# Patient Record
Sex: Female | Born: 1980
Health system: Southern US, Community
[De-identification: ages and names within clinical notes are randomized; demographics above are authoritative.]

## PROBLEM LIST (undated history)

## (undated) DIAGNOSIS — O99330 Smoking (tobacco) complicating pregnancy, unspecified trimester: Secondary | ICD-10-CM

---
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
## 2007-02-25 ENCOUNTER — Ambulatory Visit: Payer: Self-pay | Admitting: Internal Medicine

## 2012-09-17 ENCOUNTER — Observation Stay: Payer: Self-pay | Admitting: Obstetrics and Gynecology

## 2012-09-18 ENCOUNTER — Inpatient Hospital Stay: Payer: Self-pay | Admitting: Obstetrics & Gynecology

## 2012-09-18 LAB — CBC WITH DIFFERENTIAL/PLATELET
Basophil %: 0.4 %
Eosinophil %: 0.5 %
Lymphocyte #: 1.7 10*3/uL (ref 1.0–3.6)
Lymphocyte %: 11.5 %
MCHC: 34.8 g/dL (ref 32.0–36.0)
MCV: 84 fL (ref 80–100)
Monocyte #: 1 x10 3/mm — ABNORMAL HIGH (ref 0.2–0.9)
Monocyte %: 7 %
Neutrophil %: 80.6 %
RDW: 13.2 % (ref 11.5–14.5)
WBC: 14.8 10*3/uL — ABNORMAL HIGH (ref 3.6–11.0)

## 2012-09-19 LAB — HEMATOCRIT: HCT: 23.4 % — ABNORMAL LOW (ref 35.0–47.0)

## 2014-06-12 NOTE — H&P (Signed)
L&D Evaluation:  History Expanded:  HPI Patient presents to Labor and Delivery with painful contractions. Patient reports that they started yesterday and have since gotten more intense. Denies fluid leaking or bleeding. + FM.   Gravida 2   Term 0   PreTerm 0   Abortion 1   Living 0   Blood Type (Maternal) O positive   Group B Strep Results Maternal (Result >5wks must be treated as unknown) positive   Maternal HIV Negative   Maternal Syphilis Ab Nonreactive   Maternal Varicella Immune   Rubella Results (Maternal) immune   Maternal T-Dap Immune   [facility]   Presents with contractions   Patient's Medical History No Chronic Illness  other  iron deficiency anemia   Patient's Surgical History other  wisdom teeth removal   Medications Pre Natal Vitamins  Iron   Allergies NKDA   Social History none   Family History other  breast CA, stomach CA, colon CA   ROS:  ROS All systems were reviewed.  HEENT, CNS, GI, GU, Respiratory, CV, Renal and Musculoskeletal systems were found to be normal.   Exam:  Vital Signs stable   General no apparent distress   Mental Status clear   Chest clear   Heart normal sinus rhythm   Abdomen gravid, non-tender   Edema no edema   Pelvic 5/100/0 by RN   Mebranes Intact   FHT normal rate with no decels   Fetal Heart Rate 135   Skin dry   Impression:  Impression active labor   Plan:  Plan EFM/NST, monitor contractions and for cervical change, antibiotics for GBBS prophylaxis, fluids   Comments ABX for GBS Epidural as desired for pain relief Anticipate spontaneous vaginal delivery   Electronic Signatures: Letitia LibraHarris, Robert Paul (MD)  (Signed 17-Aug-14 18:07)  Authored: L&D Evaluation   Last Updated: 17-Aug-14 18:07 by Letitia LibraHarris, Robert Paul (MD)

## 2016-03-10 ENCOUNTER — Ambulatory Visit (INDEPENDENT_AMBULATORY_CARE_PROVIDER_SITE_OTHER): Payer: BLUE CROSS/BLUE SHIELD | Admitting: Primary Care

## 2016-03-10 ENCOUNTER — Encounter: Payer: Self-pay | Admitting: Primary Care

## 2016-03-10 ENCOUNTER — Other Ambulatory Visit (HOSPITAL_COMMUNITY)
Admission: RE | Admit: 2016-03-10 | Discharge: 2016-03-10 | Disposition: A | Discharge status: Home / Self Care | Payer: BLUE CROSS/BLUE SHIELD | Source: Ambulatory Visit | Attending: Primary Care | Admitting: Primary Care

## 2016-03-10 VITALS — BP 132/76 | HR 80 | Temp 98.1°F | Ht 70.0 in | Wt 146.0 lb

## 2016-03-10 DIAGNOSIS — N898 Other specified noninflammatory disorders of vagina: Secondary | ICD-10-CM | POA: Diagnosis not present

## 2016-03-10 DIAGNOSIS — Z01419 Encounter for gynecological examination (general) (routine) without abnormal findings: Secondary | ICD-10-CM | POA: Diagnosis present

## 2016-03-10 DIAGNOSIS — Z124 Encounter for screening for malignant neoplasm of cervix: Secondary | ICD-10-CM

## 2016-03-10 DIAGNOSIS — Z0000 Encounter for general adult medical examination without abnormal findings: Secondary | ICD-10-CM | POA: Diagnosis not present

## 2016-03-10 DIAGNOSIS — Z23 Encounter for immunization: Secondary | ICD-10-CM

## 2016-03-10 DIAGNOSIS — Z1151 Encounter for screening for human papillomavirus (HPV): Secondary | ICD-10-CM | POA: Insufficient documentation

## 2016-03-10 LAB — COMPREHENSIVE METABOLIC PANEL
ALT: 10 U/L (ref 0–35)
AST: 13 U/L (ref 0–37)
Albumin: 4.7 g/dL (ref 3.5–5.2)
Alkaline Phosphatase: 67 U/L (ref 39–117)
BUN: 10 mg/dL (ref 6–23)
CALCIUM: 9.4 mg/dL (ref 8.4–10.5)
CHLORIDE: 108 meq/L (ref 96–112)
CO2: 28 meq/L (ref 19–32)
Creatinine, Ser: 0.68 mg/dL (ref 0.40–1.20)
GFR: 105.2 mL/min (ref >60.00)
Glucose, Bld: 83 mg/dL (ref 70–99)
Potassium: 4 mEq/L (ref 3.5–5.1)
Sodium: 138 mEq/L (ref 135–145)
Total Bilirubin: 0.6 mg/dL (ref 0.2–1.2)
Total Protein: 7.4 g/dL (ref 6.0–8.3)

## 2016-03-10 LAB — LIPID PANEL
Cholesterol: 131 mg/dL (ref 0–200)
HDL: 37.5 mg/dL — ABNORMAL LOW (ref >39.00)
LDL CALC: 79 mg/dL (ref 0–99)
NonHDL: 93.35
TRIGLYCERIDES: 73 mg/dL (ref 0.0–149.0)
Total CHOL/HDL Ratio: 3
VLDL: 14.6 mg/dL (ref 0.0–40.0)

## 2016-03-10 NOTE — Addendum Note (Signed)
Addended by: Tawnya CrookSAMBATH, Alexxus Sobh on: 03/10/2016 10:28 AM   Modules accepted: Orders

## 2016-03-10 NOTE — Assessment & Plan Note (Signed)
Td UTD per patient, will review records. Influenza vaccination provided today. Pap due, pending. Commended her on her healthy diet, encouraged exercise. Exam today with minimal white vaginal discharge, otherwise unremarkable. Labs pending. Follow up in 1 year for repeat physical.

## 2016-03-10 NOTE — Progress Notes (Signed)
Pre visit review using our clinic review tool, if applicable. No additional management support is needed unless otherwise documented below in the visit note. 

## 2016-03-10 NOTE — Progress Notes (Signed)
Subjective:    Patient ID: Alyssa [name], female    DOB: [birth year], 36 y.o.   MRN: [id number]  HPI  Alyssa [name] is a 36 year old female who presents today to establish care and discuss the problems mentioned below. Will obtain old records.  who presents today for complete physical.  Immunizations: -Tetanus: Unsure -Influenza: Due   Diet: She endorses a fair diet. Breakfast: Bagel, cream cheese, fruit Lunch: Sandwich, chips Dinner: Meat, vegetables, starch Snacks: Fruit Desserts: None Beverages: Water, soda  Exercise: She does not currently exercise Eye exam: Completed in 2017 Dental exam: Completes annually, plans to schedule Pap Smear: Follows with GYN.   Review of Systems  Constitutional: Negative for unexpected weight change.  HENT: Negative for rhinorrhea.   Respiratory: Negative for cough and shortness of breath.   Cardiovascular: Negative for chest pain.  Gastrointestinal: Negative for constipation and diarrhea.  Genitourinary: Positive for vaginal discharge. Negative for difficulty urinating, genital sores, menstrual problem and pelvic pain.       Thick, clear discharge intermittently for the last several weeks. She denies pain, vaginal itching.   Musculoskeletal: Negative for arthralgias and myalgias.  Skin: Negative for rash.  Allergic/Immunologic: Negative for environmental allergies.  Neurological: Negative for dizziness, numbness and headaches.  Psychiatric/Behavioral:       She denies concerns for anxiety or depression       No past medical history on file.   Social History   Social History  . Marital status: Single    Spouse name: N/A  . Number of children: N/A  . Years of education: N/A   Occupational History  . Not on file.   Social History Main Topics  . Smoking status: Current Every Day Smoker  . Smokeless tobacco: Never Used  . Alcohol use Yes  . Drug use: Unknown  . Sexual activity: Not on file   Other Topics Concern  . Not on  file   Social History Narrative   Engaged.   2 children.   Works as a [employer] certified accountant.   Enjoys spending time with children.      No past surgical history on file.  Family History  Problem Relation Age of Onset  . Hypertension Mother   . Hypertension Brother   . Breast cancer Maternal Grandmother   . Colon cancer Maternal Grandfather   . Breast cancer Paternal Grandmother     No Known Allergies  No current outpatient prescriptions on file prior to visit.   No current facility-administered medications on file prior to visit.     BP 132/76   Pulse 80   Temp 98.1 F (36.7 C) (Oral)   Ht 5\' 10"  (1.778 m)   Wt 146 lb (66.2 kg)   LMP 02/09/2016 (Within Days)   SpO2 99%   BMI 20.95 kg/m    Objective:   Physical Exam  Constitutional: She is oriented to person, place, and time. She appears well-nourished.  HENT:  Right Ear: Tympanic membrane and ear canal normal.  Left Ear: Tympanic membrane and ear canal normal.  Nose: Nose normal.  Mouth/Throat: Oropharynx is clear and moist.  Eyes: Conjunctivae and EOM are normal. Pupils are equal, round, and reactive to light.  Neck: Neck supple. No thyromegaly present.  Cardiovascular: Normal rate and regular rhythm.   No murmur heard. Pulmonary/Chest: Effort normal and breath sounds normal. She has no rales.  Abdominal: Soft. Bowel sounds are normal. There is no tenderness.  Genitourinary: There is no tenderness or lesion on  the right labia. There is no tenderness or lesion on the left labia. Cervix exhibits discharge. Cervix exhibits no motion tenderness. Vaginal discharge found.  Genitourinary Comments: Thick, whitish discharge  Musculoskeletal: Normal range of motion.  Lymphadenopathy:    She has no cervical adenopathy.  Neurological: She is alert and oriented to person, place, and time. She has normal reflexes. No cranial nerve deficit.  Skin: Skin is warm and dry. No rash noted.  Psychiatric: She has a normal mood and affect.            Assessment & Plan:  Vaginal Discharge:  Thick, clear, clumpy for the past several weeks. Intermittent. No vaginal itching, pelvic pain, urinary symptoms. Exam today: small amount of thick, white discharge, otherwise unremarkable. Wet Prep and Gonorrhea/Chlamydia sent off and is pending. Will await results.  Morrie Sheldonlark,Katherine Kendal, NP

## 2016-03-10 NOTE — Patient Instructions (Signed)
Complete lab work prior to leaving today. I will notify you of your lab and vaginal results once received.   Continue to eat a balanced diet. Limit sugary drinks.  Ensure you are consuming 64 ounces of water daily.  Start exercising. You should be getting 150 minutes of moderate intensity exercise weekly.  Follow up in 1 year for a repeat physical or sooner if needed.  It was a pleasure to meet you today! Please don't hesitate to call me with any questions. [facility] to [facility]!

## 2016-03-11 LAB — WET PREP BY MOLECULAR PROBE
CANDIDA SPECIES: NEGATIVE
GARDNERELLA VAGINALIS: NEGATIVE
Trichomonas vaginosis: NEGATIVE

## 2016-03-11 LAB — GC/CHLAMYDIA PROBE AMP
CT PROBE, AMP APTIMA: NOT DETECTED
GC Probe RNA: NOT DETECTED

## 2016-03-11 LAB — CYTOLOGY - PAP
Diagnosis: NEGATIVE
HPV: NOT DETECTED

## 2016-03-30 ENCOUNTER — Ambulatory Visit (INDEPENDENT_AMBULATORY_CARE_PROVIDER_SITE_OTHER): Payer: BLUE CROSS/BLUE SHIELD | Admitting: Podiatry

## 2016-03-30 ENCOUNTER — Encounter: Payer: Self-pay | Admitting: Podiatry

## 2016-03-30 VITALS — BP 121/78 | HR 80 | Resp 16

## 2016-03-30 DIAGNOSIS — L600 Ingrowing nail: Secondary | ICD-10-CM

## 2016-03-30 DIAGNOSIS — L603 Nail dystrophy: Secondary | ICD-10-CM | POA: Diagnosis not present

## 2016-03-30 MED ORDER — NEOMYCIN-POLYMYXIN-HC 1 % OT SOLN: OTIC | 1 refills | Status: DC

## 2016-03-30 NOTE — Patient Instructions (Signed)

## 2016-03-30 NOTE — Progress Notes (Signed)
   Subjective:    Patient ID: Larinda ButteryErika K Jensen, female    DOB: Jul 28, 1980, 36 y.o.   MRN: [id number]  HPI: Vital signs are stable she is alert and oriented 3. She presents today with chief concern of painful ingrown toenails to the tibia and fibula borders of the hallux bilateral. Also concerned about the discoloration of the nails.    Review of Systems  Neurological: Positive for numbness.       Hands   All other systems reviewed and are negative.      Objective:   Physical Exam: Vital signs are stable alert and oriented 3. Pulses are strongly palpable. Neurologic sensorium is intact. Deep tendon reflexes are intact. Muscle strength is 5 over 5 dorsiflexion plantar flexors and inverters everters onto the musculature is intact. Orthopedic evaluation shows all joints distal to the ankle full range of motion without crepitation. Cutaneous evaluation of Mr. Sharpe rated now margins with mild erythema and edema along the tibial borders of the hallux bilateral.        Assessment & Plan:  Ingrown toenail tibial border hallux bilateral.  Plan: Chemical matrixectomy's were performed today along the tibia-fibula border after local anesthesia was administered. She tolerated this procedure well without complications. She is provided with bipolar written home-going instructions for care and soaking of her toe as well as a prescription for Cortisporin Otic to be applied twice daily upset. Several of the nail and skin taken from the distal aspect of the nails today and sent for pathologic evaluation.

## 2016-04-20 ENCOUNTER — Ambulatory Visit (INDEPENDENT_AMBULATORY_CARE_PROVIDER_SITE_OTHER): Payer: BLUE CROSS/BLUE SHIELD | Admitting: Podiatry

## 2016-04-20 ENCOUNTER — Encounter: Payer: Self-pay | Admitting: Podiatry

## 2016-04-20 DIAGNOSIS — L03031 Cellulitis of right toe: Secondary | ICD-10-CM

## 2016-04-20 MED ORDER — DOXYCYCLINE HYCLATE 100 MG PO TABS: 100.0000 mg | ORAL_TABLET | Freq: Two times a day (BID) | ORAL | 0 refills | Status: DC

## 2016-04-20 NOTE — Progress Notes (Signed)
She presents today for follow-up of matrixectomy, hallux bilaterally. She states that they're doing pretty good with exception of this much force in the fibular border of the hallux right. She is also here to talk about her pathology from her nails. She's been soaking 1 gallon with 1 tablespoon of Epsom salts.  Objective: Vital signs are stable alert and oriented 3. Pulses are palpable. Neurologic sensorium is intact percent was C monofilament. Deep tendon reflexes are intact. Muscle strength is 5 over 5 dorsally splint flexors everters evertors of his musculature is intact. Mild erythema along the proximal nail fold of the right hallux fibular border mild paronychia is present status post matrixectomy. Pathology report demonstrates negative for fungus.  Assessment: Paronychia abscess fibular border hallux right. Negative fungus.  Plan: Increase the Epsom salts 1 cup per gallon of water. I also recommended that she start doxycycline 150 mg twice daily follow-up with her in 2 weeks.

## 2016-04-21 ENCOUNTER — Telehealth: Payer: Self-pay | Admitting: *Deleted

## 2016-04-21 NOTE — Telephone Encounter (Addendum)
-----   Message from Elinor ParkinsonMax T Hyatt, [place]PM sent at 04/11/2016 11:44 AM EST ----- Negative for fungus. 04/21/2016-Left message that Dr. Al CorpusHyatt had reviewed her culture results as negative and no treatment was needed at this time.

## 2016-05-11 ENCOUNTER — Ambulatory Visit (INDEPENDENT_AMBULATORY_CARE_PROVIDER_SITE_OTHER): Payer: BLUE CROSS/BLUE SHIELD | Admitting: Podiatry

## 2016-05-11 ENCOUNTER — Encounter: Payer: Self-pay | Admitting: Podiatry

## 2016-05-11 DIAGNOSIS — L03031 Cellulitis of right toe: Secondary | ICD-10-CM | POA: Diagnosis not present

## 2016-05-11 NOTE — Progress Notes (Signed)
She presents today for follow-up of her paronychia. She states is doing much better is still a [place] pink in the back corner she refers fibular border hallux right.  Objective: Mild erythema no purulence or drainage is noted. Mild tenderness.  Assessment: Paronychia resolving hallux right status post matrixectomy.  Plan: Discontinue Cortisporin otic drops. Soak once daily covered during the day and leave open at bedtime. Follow-up with me in 2 weeks if not improved. I expressed to her that it may be necessary to perform another procedure.

## 2016-06-18 ENCOUNTER — Emergency Department
Admission: EM | Admit: 2016-06-18 | Discharge: 2016-06-18 | Disposition: A | Discharge status: Home / Self Care | Payer: BLUE CROSS/BLUE SHIELD | Attending: Emergency Medicine | Admitting: Emergency Medicine

## 2016-06-18 ENCOUNTER — Encounter: Payer: Self-pay | Admitting: *Deleted

## 2016-06-18 DIAGNOSIS — R1032 Left lower quadrant pain: Secondary | ICD-10-CM | POA: Diagnosis not present

## 2016-06-18 DIAGNOSIS — R197 Diarrhea, unspecified: Secondary | ICD-10-CM | POA: Diagnosis not present

## 2016-06-18 DIAGNOSIS — F17220 Nicotine dependence, chewing tobacco, uncomplicated: Secondary | ICD-10-CM | POA: Insufficient documentation

## 2016-06-18 LAB — CBC
HCT: 40 % (ref 35.0–47.0)
HEMOGLOBIN: 13.8 g/dL (ref 12.0–16.0)
MCH: 28.6 pg (ref 26.0–34.0)
MCHC: 34.4 g/dL (ref 32.0–36.0)
MCV: 83.1 fL (ref 80.0–100.0)
Platelets: 203 10*3/uL (ref 150–440)
RBC: 4.81 MIL/uL (ref 3.80–5.20)
RDW: 13.5 % (ref 11.5–14.5)
WBC: 7.3 10*3/uL (ref 3.6–11.0)

## 2016-06-18 LAB — COMPREHENSIVE METABOLIC PANEL
ALT: 26 U/L (ref 14–54)
ANION GAP: 8 (ref 5–15)
AST: 32 U/L (ref 15–41)
Albumin: 4.9 g/dL (ref 3.5–5.0)
Alkaline Phosphatase: 75 U/L (ref 38–126)
BUN: 5 mg/dL — ABNORMAL LOW (ref 6–20)
CHLORIDE: 102 mmol/L (ref 101–111)
CO2: 25 mmol/L (ref 22–32)
CREATININE: 0.7 mg/dL (ref 0.44–1.00)
Calcium: 9.1 mg/dL (ref 8.9–10.3)
GFR calc non Af Amer: >60 mL/min (ref >60)
Glucose, Bld: 90 mg/dL (ref 65–99)
Potassium: 3 mmol/L — ABNORMAL LOW (ref 3.5–5.1)
SODIUM: 135 mmol/L (ref 135–145)
Total Bilirubin: 0.5 mg/dL (ref 0.3–1.2)
Total Protein: 8.3 g/dL — ABNORMAL HIGH (ref 6.5–8.1)

## 2016-06-18 LAB — URINALYSIS, COMPLETE (UACMP) WITH MICROSCOPIC
Bilirubin Urine: NEGATIVE
Glucose, UA: NEGATIVE mg/dL
Hgb urine dipstick: NEGATIVE
KETONES UR: NEGATIVE mg/dL
Leukocytes, UA: NEGATIVE
Nitrite: NEGATIVE
PROTEIN: 30 mg/dL — AB
Specific Gravity, Urine: 1.016 (ref 1.005–1.030)
pH: 5 (ref 5.0–8.0)

## 2016-06-18 LAB — POC URINE PREG, ED: Preg Test, Ur: NEGATIVE

## 2016-06-18 LAB — LIPASE, BLOOD: Lipase: 16 U/L (ref 11–51)

## 2016-06-18 MED ORDER — LOPERAMIDE HCL 2 MG PO CAPS
2.0000 mg | ORAL_CAPSULE | Freq: Once | ORAL | Status: AC
  Administered 2016-06-18: 2 mg via ORAL
  Filled 2016-06-18: qty 1

## 2016-06-18 MED ORDER — DICYCLOMINE HCL 20 MG PO TABS: 20.0000 mg | ORAL_TABLET | Freq: Three times a day (TID) | ORAL | 0 refills | Status: DC | PRN

## 2016-06-18 MED ORDER — LOPERAMIDE HCL 2 MG PO CHEW: 1.0000 | CHEWABLE_TABLET | Freq: Four times a day (QID) | ORAL | 0 refills | Status: DC | PRN

## 2016-06-18 MED ORDER — DICYCLOMINE HCL 10 MG PO CAPS
20.0000 mg | ORAL_CAPSULE | Freq: Once | ORAL | Status: AC
  Administered 2016-06-18: 20 mg via ORAL
  Filled 2016-06-18: qty 2

## 2016-06-18 MED ORDER — CIPROFLOXACIN HCL 500 MG PO TABS
750.0000 mg | ORAL_TABLET | Freq: Once | ORAL | Status: AC
  Administered 2016-06-18: 750 mg via ORAL
  Filled 2016-06-18: qty 2

## 2016-06-18 NOTE — ED Triage Notes (Signed)
Pt to ED reporting abd pain today with bloating and constipation. Pt reports having been nauseous and having diarrhea since Monday but then beginning today pt can not have BM and feels "pregnant" Pt reports generalized abd pain with intermittent sharp 9/1 pains. Bowel sounds still intact. No fevers reported.

## 2016-06-18 NOTE — Discharge Instructions (Signed)
If your pain persists for another 48 hours please return to the emergency department for reevaluation. Please return sooner for any new or worsening symptoms such as if you cannot eat or drink, for fevers or chills, or for any other concerns whatsoever.  It was a pleasure to take care of you today, and thank you for coming to our emergency department.  If you have any questions or concerns before leaving please ask the nurse to grab me and I'm more than happy to go through your aftercare instructions again.  If you were prescribed any opioid pain medication today such as Norco, Vicodin, Percocet, morphine, hydrocodone, or oxycodone please make sure you do not drive when you are taking this medication as it can alter your ability to drive safely.  If you have any concerns once you are home that you are not improving or are in fact getting worse before you can make it to your follow-up appointment, please do not hesitate to call 911 and come back for further evaluation.  Merrily BrittleNeil Elvis Laufer MD  Results for orders placed or performed during the hospital encounter of 06/18/16  Lipase, blood  Result Value Ref Range   Lipase 16 11 - 51 U/L  Comprehensive metabolic panel  Result Value Ref Range   Sodium 135 135 - 145 mmol/L   Potassium 3.0 (L) 3.5 - 5.1 mmol/L   Chloride 102 101 - 111 mmol/L   CO2 25 22 - 32 mmol/L   Glucose, Bld 90 65 - 99 mg/dL   BUN 5 (L) 6 - 20 mg/dL   Creatinine, Ser 1.610.70 0.44 - 1.00 mg/dL   Calcium 9.1 8.9 - 09.610.3 mg/dL   Total Protein 8.3 (H) 6.5 - 8.1 g/dL   Albumin 4.9 3.5 - 5.0 g/dL   AST 32 15 - 41 U/L   ALT 26 14 - 54 U/L   Alkaline Phosphatase 75 38 - 126 U/L   Total Bilirubin 0.5 0.3 - 1.2 mg/dL   GFR calc non Af Amer >60 >60 mL/min   GFR calc Af Amer >60 >60 mL/min   Anion gap 8 5 - 15  CBC  Result Value Ref Range   WBC 7.3 3.6 - 11.0 K/uL   RBC 4.81 3.80 - 5.20 MIL/uL   Hemoglobin 13.8 12.0 - 16.0 g/dL   HCT 04.540.0 40.935.0 - 81.147.0 %   MCV 83.1 80.0 - 100.0 fL   MCH 28.6 26.0 - 34.0 pg   MCHC 34.4 32.0 - 36.0 g/dL   RDW 91.413.5 78.211.5 - 95.614.5 %   Platelets 203 150 - 440 K/uL  Urinalysis, Complete w Microscopic  Result Value Ref Range   Color, Urine YELLOW (A) YELLOW   APPearance HAZY (A) CLEAR   Specific Gravity, Urine 1.016 1.005 - 1.030   pH 5.0 5.0 - 8.0   Glucose, UA NEGATIVE NEGATIVE mg/dL   Hgb urine dipstick NEGATIVE NEGATIVE   Bilirubin Urine NEGATIVE NEGATIVE   Ketones, ur NEGATIVE NEGATIVE mg/dL   Protein, ur 30 (A) NEGATIVE mg/dL   Nitrite NEGATIVE NEGATIVE   Leukocytes, UA NEGATIVE NEGATIVE   RBC / HPF 0-5 0 - 5 RBC/hpf   WBC, UA 0-5 0 - 5 WBC/hpf   Bacteria, UA RARE (A) NONE SEEN   Squamous Epithelial / LPF 0-5 (A) NONE SEEN   Mucous PRESENT   POC Urine Pregnancy, ED  Result Value Ref Range   Preg Test, Ur Negative Negative

## 2016-06-18 NOTE — ED Provider Notes (Signed)
[facility] Emergency Department Provider Note  ____________________________________________   First MD Initiated Contact with Patient 06/18/16 1518     (approximate)  I have reviewed the triage vital signs and the nursing notes.   HISTORY Chief Complaint Abdominal Pain    HPI Alyssa [name] is a 36 y.o. female who self presents to the emergency department with 5 days of loose watery stools and 1 day of cramping left lower quadrant abdominal pain. She estimates she has had roughly 15 loose watery stools that she describes as "butt pee" a day for the past 5 days although it is improving and she has not had a bowel movement in over 4 hours. Beginning today she had cramping left lower quadrant abdominal pain worse when defecating improved with rest. She has no abdominal surgical history. She has not gone camping recently and is not trunk stream water. She has no recent antibiotics or hospitalizations. She has no sick contacts.   History reviewed. No pertinent past medical history.  Patient Active Problem List   Diagnosis Date Noted  . Preventative health care 03/10/2016    History reviewed. No pertinent surgical history.  Prior to Admission medications   Medication Sig Start Date End Date Taking? Authorizing Provider  dicyclomine (BENTYL) 20 MG tablet Take 1 tablet (20 mg total) by mouth 3 (three) times daily as needed for spasms. 06/18/16 06/18/17  Merrily Brittle, MD  Loperamide HCl 2 MG CHEW Chew 1 tablet (2 mg total) by mouth every 6 (six) hours as needed (diarrhea). 06/18/16   Merrily Brittle, MD  NEOMYCIN-POLYMYXIN-HYDROCORTISONE (CORTISPORIN) 1 % SOLN otic solution Apply 1-2 drops to toe BID after soaking 03/30/16   Hyatt, Max T, DPM    Allergies Patient has no known allergies.  Family History  Problem Relation Age of Onset  . Hypertension Mother   . Hypertension Brother   . Breast cancer Maternal Grandmother   . Colon cancer Maternal Grandfather    . Breast cancer Paternal Grandmother     Social History Social History  Substance Use Topics  . Smoking status: Current Every Day Smoker  . Smokeless tobacco: Never Used  . Alcohol use Yes    Review of Systems Constitutional: No fever/chills ENT: No sore throat. Cardiovascular: Denies chest pain. Respiratory: Denies shortness of breath. Gastrointestinal: Positive abdominal pain.  Positive nausea, no vomiting.  Positive diarrhea.  No constipation. Musculoskeletal: Negative for back pain. Neurological: Negative for headaches   ____________________________________________   PHYSICAL EXAM:  VITAL SIGNS: ED Triage Vitals  Enc Vitals Group     BP 06/18/16 1417 121/89     Pulse Rate 06/18/16 1417 85     Resp 06/18/16 1417 16     Temp 06/18/16 1417 99 F (37.2 C)     Temp Source 06/18/16 1417 Oral     SpO2 06/18/16 1417 99 %     Weight 06/18/16 1409 160 lb (72.6 kg)     Height 06/18/16 1409 5\' 11"  (1.803 m)     Head Circumference --      Peak Flow --      Pain Score 06/18/16 1409 8     Pain Loc --      Pain Edu? --      Excl. in GC? --     Constitutional: Alert and oriented x 4 well appearing nontoxic no diaphoresis speaks in full, clear sentences Head: Atraumatic. Nose: No congestion/rhinnorhea. Mouth/Throat: No trismus Neck: No stridor.   Cardiovascular: Regular rate and rhythm  no murmurs Respiratory: Normal respiratory effort.  No retractions. Gastrointestinal: Soft nondistended quite tender left lower quadrant without rebound or guarding no peritonitis no McBurney's tenderness negative Rovsing's hyperactive bowel sounds Neurologic:  Normal speech and language. No gross focal neurologic deficits are appreciated.  Skin:  Skin is warm, dry and intact. No rash noted.    ____________________________________________  LABS (all labs ordered are listed, but only abnormal results are displayed)  Labs Reviewed  COMPREHENSIVE METABOLIC PANEL - Abnormal; Notable for  the following:       Result Value   Potassium 3.0 (*)    BUN 5 (*)    Total Protein 8.3 (*)    All other components within normal limits  URINALYSIS, COMPLETE (UACMP) WITH MICROSCOPIC - Abnormal; Notable for the following:    Color, Urine YELLOW (*)    APPearance HAZY (*)    Protein, ur 30 (*)    Bacteria, UA RARE (*)    Squamous Epithelial / LPF 0-5 (*)    All other components within normal limits  LIPASE, BLOOD  CBC  POC URINE PREG, ED    Normal renal function labs unremarkable __________________________________________  EKG   ____________________________________________  RADIOLOGY   ____________________________________________   PROCEDURES  Procedure(s) performed: no  Procedures  Critical Care performed: no  Observation: no ____________________________________________   INITIAL IMPRESSION / ASSESSMENT AND PLAN / ED COURSE  Pertinent labs & imaging results that were available during my care of the patient were reviewed by me and considered in my medical decision making (see chart for details).  The patient arrives very well-appearing with a tender left lower quadrant hyperactive bowel sounds and copious diarrhea which are all consistent with infectious diarrhea. It is likely viral however she is unable to provide a stool sample in the treatment is a single dose of ciprofloxacin here which I think is reasonable. I will treat her symptomatically with dicyclomine loperamide and the Cipro and encouraged her to have close abdominal recheck. Strict return precautions given. She is discharged home in improved condition.      ____________________________________________   FINAL CLINICAL IMPRESSION(S) / ED DIAGNOSES  Final diagnoses:  Left lower quadrant pain  Diarrhea of presumed infectious origin      NEW MEDICATIONS STARTED DURING THIS VISIT:  Discharge Medication List as of 06/18/2016  3:30 PM    START taking these medications   Details  dicyclomine  (BENTYL) 20 MG tablet Take 1 tablet (20 mg total) by mouth 3 (three) times daily as needed for spasms., Starting Thu 06/18/2016, Until Fri 06/18/2017, Print    Loperamide HCl 2 MG CHEW Chew 1 tablet (2 mg total) by mouth every 6 (six) hours as needed (diarrhea)., Starting Thu 06/18/2016, Print         Note:  This document was prepared using Dragon voice recognition software and may include unintentional dictation errors.      Merrily Brittleifenbark, Bonny Egger, MD 06/18/16 [phone]

## 2016-06-19 ENCOUNTER — Ambulatory Visit: Payer: BLUE CROSS/BLUE SHIELD | Admitting: Family Medicine

## 2016-06-19 DIAGNOSIS — Z0289 Encounter for other administrative examinations: Secondary | ICD-10-CM

## 2016-09-21 ENCOUNTER — Ambulatory Visit (INDEPENDENT_AMBULATORY_CARE_PROVIDER_SITE_OTHER): Payer: BLUE CROSS/BLUE SHIELD | Admitting: Podiatry

## 2016-09-21 ENCOUNTER — Encounter: Payer: Self-pay | Admitting: Podiatry

## 2016-09-21 DIAGNOSIS — L600 Ingrowing nail: Secondary | ICD-10-CM

## 2016-09-21 NOTE — Progress Notes (Signed)
She presents today for follow-up of matrixectomy tibial border hallux left. She states a full small piece of nail out of there just the other day and seems to be doing better.  Objective: Vital signs are stable she is alert and oriented 3 both surgical nail sites tibial and fibular borders of the hallux bilaterally appear to be healing very nicely she does have some reactive callus along the medial or tibial side of the hallux left. Currently I see no infection. I see no regrowth of nail.  Assessment: Painful nail hallux left.  Plan: Follow up with me should this nail grow back in the next 3-4 months which time we performed a follow-up chemical matrixectomy.

## 2016-11-18 ENCOUNTER — Encounter: Payer: Self-pay | Admitting: Obstetrics and Gynecology

## 2016-11-18 ENCOUNTER — Ambulatory Visit (INDEPENDENT_AMBULATORY_CARE_PROVIDER_SITE_OTHER): Payer: Managed Care, Other (non HMO) | Admitting: Obstetrics and Gynecology

## 2016-11-18 VITALS — BP 126/72 | HR 69 | Ht 69.0 in | Wt 150.0 lb

## 2016-11-18 DIAGNOSIS — B373 Candidiasis of vulva and vagina: Secondary | ICD-10-CM

## 2016-11-18 DIAGNOSIS — N911 Secondary amenorrhea: Secondary | ICD-10-CM | POA: Diagnosis not present

## 2016-11-18 DIAGNOSIS — B3731 Acute candidiasis of vulva and vagina: Secondary | ICD-10-CM

## 2016-11-18 DIAGNOSIS — N912 Amenorrhea, unspecified: Secondary | ICD-10-CM

## 2016-11-18 LAB — POCT URINE PREGNANCY: Preg Test, Ur: NEGATIVE

## 2016-11-18 MED ORDER — FLUCONAZOLE 150 MG PO TABS
150.0000 mg | ORAL_TABLET | Freq: Once | ORAL | 0 refills | Status: AC
Start: 2016-11-18 — End: 2016-11-18

## 2016-11-18 NOTE — Progress Notes (Signed)
Obstetrics & Gynecology Office Visit   Chief Complaint:  Chief Complaint  Patient presents with  . Amenorrhea    no cycle since September/negative home tests  . vaginal irritation    [name] w/odor    History of Present Illness: 36 y.o. G2P2 presenting for the evaluation of absent menstruation.  The patient report Patient's last menstrual period was 10/03/2016.Marland Kitchen.  Preceding the current episode of amenorrhea, the patient's menstrual cycles had been regular monthly.  The patient is currently sexually active and is using coitus interruptus for contraception.  She does not have any contributing past medical history, hypothyroidism, hyperthyroidism, a history of infertility, family history of autism, family history of MR, family history of fragile X, prior chemotherapy exposure and prior radiation therapy.  The patient has not started any new medications coinciding with the onset of her symptoms.  The patient is interested in conceiving in the near future.  She does not exercise excessively.  PCOS/Cushings Wt Change: no Hirsutism: no Acne: no Balding: no Lipodystrophy:  no Acanthosis nigricans:  no Striae:  no  Hyperprolactinemia Galactorrhea: Has stopped breast feeding this year, no galactorrhea but still occassional see a Alyssa [name] inside nipple Headaches: no Vision Changes:  no Prior obstetric hemorrhage: no  Thyroid Temperature Intolerance: no Constipation or Diarrhea: no Hair Thinning:  no Palpitations: no  Outlet Obstruction: Prior D&C: no Prior myomectomy: no Prior LEEP or CKC: no  Ms. Alyssa [name] is a 36 y.o. G2P2 who LMP was Patient's last menstrual period was 10/03/2016., presents today for a problem visit.   Patient complains of an abnormal vaginal [name] for 3 days. [name] described as: white. Vaginal symptoms include burning and local irritation.Vulvar symptoms include burning and local irritation.STI Risk: Very low risk of STD exposure.    Other associated symptoms: none. Contraception: coitus interruptus.  She denies recent antibiotic exposure, denies changes in soaps, detergents coinciding with the onset of her symptoms.  She has not previously self treated or been under treatment by another provider for these symptoms.   Review of Systems: Review of Systems  Constitutional: Negative for chills, fever and weight loss.  Eyes: Negative for blurred vision, double vision and pain.  Gastrointestinal: Negative for abdominal pain, constipation, diarrhea, nausea and vomiting.  Genitourinary: Negative for dysuria, frequency and urgency.  Skin: Positive for itching. Negative for rash.  Neurological: Negative for headaches.    Past Medical History:  History reviewed. No pertinent past medical history.  Past Surgical History:  History reviewed. No pertinent surgical history.  Gynecologic History: Patient's last menstrual period was 10/03/2016.  Obstetric History: G2P2  Family History:  Family History  Problem Relation Age of Onset  . Hypertension Mother   . Hypertension Brother   . Breast cancer Maternal Grandmother   . Colon cancer Maternal Grandfather   . Breast cancer Paternal Grandmother     Social History:  Social History   Social History  . Marital status: Single    Spouse name: N/A  . Number of children: N/A  . Years of education: N/A   Occupational History  . Not on file.   Social History Main Topics  . Smoking status: Current Every Day Smoker  . Smokeless tobacco: Never Used  . Alcohol use Yes  . Drug use: No  . Sexual activity: Yes    Birth control/ protection: None   Other Topics Concern  . Not on file   Social History Narrative   Engaged.   2  children.   Works as a Chartered certified accountant.   Enjoys spending time with children.      Allergies:  No Known Allergies  Medications: Prior to Admission medications   Not on File    Physical Exam Vitals:  Vitals:   11/18/16 1109  BP: 126/72  Pulse:  69   Patient's last menstrual period was 10/03/2016. Body mass index is 22.15 kg/m.  General: NAD HEENT: normocephalic, anicteric Pulmonary: No increased work of breathing Genitourinary:  External: Normal external female genitalia.  Normal urethral meatus, normal Bartholin's and Skene's glands.    Rectal: deferred  Lymphatic: no evidence of inguinal lymphadenopathy Extremities: no edema, erythema, or tenderness Neurologic: Grossly intact Psychiatric: mood appropriate, affect full  Female chaperone present for pelvic portion of the physical exam  Wet Prep: PH: <4.5 Clue Cells: Negative Fungal elements: Positive Trichomonas: Negative  Assessment: 36 y.o. G2P2 presenting with secondary amenorrhea  Plan: Problem List Items Addressed This Visit    None    Visit Diagnoses    Amenorrhea    -  Primary   Relevant Orders   POCT urine pregnancy (Completed)   Secondary amenorrhea       Relevant Orders   TSH+Prl+FSH+TestT+LH+DHEA S...   US PELVIS TRANSVANGINAL NON-OB (TV ONLY)   Vaginal candida          1) The risk long term risk of amenorrhea were discussed with the patient.  The role of unopposed estrogen in causing amenorrhea and the possible development of endometrial hyperplasia or carcinoma is discussed.  The risk of endometrial hyperplasia is linearly correlated with increasing BMI given the production of estrone by adipose tissue.    2) Although amenorrhea is the patients presenting complaint, we discussed that rather than simply addressing her symptom the goal of our work up would be aimed at identifying the underlying cause.  Disruption in the axis of any number of endocrine systems may evidence itself in the form of amenorrhea.  3) Patient will be started on a 10 day provera challenge to assess for the presence of estrogen and rule out outlet obstruction if ultrasound and labs appear normal    4) Ultrasound ordered for endometrial strip assessment and evaluation of  ovaries  5) Vaginal candida - rx diflucan 150mg  po once with option to repeat dose in 1 week if symptoms persist  6) A total of 15 minutes were spent in face-to-face contact with the patient during this encounter with over half of that time devoted to counseling and coordination of care.

## 2016-11-21 LAB — TSH+PRL+FSH+TESTT+LH+DHEA S...
17 HYDROXYPROGESTERONE: 236 ng/dL
Androstenedione: 161 ng/dL (ref 41–262)
DHEA SO4: 282.8 ug/dL (ref 84.8–378.0)
FSH: 5.6 m[IU]/mL
LH: 18.6 m[IU]/mL
Prolactin: 6.3 ng/mL (ref 4.8–23.3)
TESTOSTERONE FREE: 3 pg/mL (ref 0.0–4.2)
TSH: 1.82 u[IU]/mL (ref 0.450–4.500)
Testosterone: 32 ng/dL (ref 8–48)

## 2016-11-28 ENCOUNTER — Encounter: Payer: Self-pay | Admitting: Obstetrics and Gynecology

## 2016-11-29 ENCOUNTER — Other Ambulatory Visit: Payer: Self-pay | Admitting: Obstetrics and Gynecology

## 2016-11-29 MED ORDER — MEDROXYPROGESTERONE ACETATE 10 MG PO TABS: 10.0000 mg | ORAL_TABLET | Freq: Every day | ORAL | 0 refills | Status: DC

## 2016-12-02 ENCOUNTER — Other Ambulatory Visit: Payer: Managed Care, Other (non HMO)

## 2016-12-02 ENCOUNTER — Ambulatory Visit: Payer: Managed Care, Other (non HMO) | Admitting: Obstetrics and Gynecology

## 2017-01-20 ENCOUNTER — Encounter: Payer: Self-pay | Admitting: Podiatry

## 2017-01-20 ENCOUNTER — Ambulatory Visit (INDEPENDENT_AMBULATORY_CARE_PROVIDER_SITE_OTHER): Payer: Managed Care, Other (non HMO) | Admitting: Podiatry

## 2017-01-20 DIAGNOSIS — L600 Ingrowing nail: Secondary | ICD-10-CM | POA: Diagnosis not present

## 2017-01-20 MED ORDER — NEOMYCIN-POLYMYXIN-HC 1 % OT SOLN: OTIC | 1 refills | Status: DC

## 2017-01-20 NOTE — Patient Instructions (Signed)

## 2017-01-20 NOTE — Progress Notes (Signed)
She presents today for follow-up of her big toe she states that is still bothering her she refers to the left hallux.  Objective: Vital signs are stable she is alert and oriented x3 for the nail procedure was performed before there is a small spicule of nail that appears to be growing back.  This is causing her discomfort and she would like the nail margin to be touching the skin.  So at this point she would like to have this removed.  Assessment: Ingrown nail tibial border hallux left.  Plan: Chemical matricectomy was performed today after local anesthesia was administered she tolerated procedure well without complications.  She is provided with both oral and written home-going instruction for care and soaking of her toe as well as prescription for Cortisporin Otic.  Follow up with her in 1-2 weeks.

## 2017-02-08 ENCOUNTER — Encounter: Payer: Self-pay | Admitting: Podiatry

## 2017-02-08 ENCOUNTER — Ambulatory Visit (INDEPENDENT_AMBULATORY_CARE_PROVIDER_SITE_OTHER): Payer: Managed Care, Other (non HMO) | Admitting: Podiatry

## 2017-02-08 DIAGNOSIS — L600 Ingrowing nail: Secondary | ICD-10-CM

## 2017-02-08 NOTE — Patient Instructions (Signed)

## 2017-02-08 NOTE — Progress Notes (Signed)
Presents today for follow-up of matrixectomy to the hallux left.  She states that is doing just great.  Objective: Vital signs are stable alert and oriented x3 there is no erythema edema cellulitis drainage or odor.  Pulses are palpable.  No signs of infection.  Assessment: Well-healing matrixectomy hallux left.  Plan: I would recommend that she continue to soak in Epsom salts and warm water at least once every other day until no tenderness on palpation.  Follow-up with me should anything other than improvement arise.

## 2017-11-08 ENCOUNTER — Ambulatory Visit (INDEPENDENT_AMBULATORY_CARE_PROVIDER_SITE_OTHER): Payer: Managed Care, Other (non HMO) | Admitting: Family Medicine

## 2017-11-08 ENCOUNTER — Encounter: Payer: Self-pay | Admitting: Family Medicine

## 2017-11-08 VITALS — BP 108/72 | HR 92 | Temp 98.4°F | Ht 69.0 in | Wt 146.0 lb

## 2017-11-08 DIAGNOSIS — N6459 Other signs and symptoms in breast: Secondary | ICD-10-CM | POA: Diagnosis not present

## 2017-11-08 DIAGNOSIS — N644 Mastodynia: Secondary | ICD-10-CM | POA: Diagnosis not present

## 2017-11-08 NOTE — Progress Notes (Signed)
   Subjective:    Patient ID: Alyssa [name], female    DOB: 02-Aug-1980, 37 y.o.   MRN: [id number]  HPI This is a 37 yo female, accompanied by her preschool aged son, who presents today with left breast pain x 2 weeks. No known trauma. No nipple discharge. Behind nipple area she feels small bump, not sure if left over from breast feeding.   Patient's last menstrual period was 10/25/2017.   No past medical history on file. No past surgical history on file. Family History  Problem Relation Age of Onset  . Hypertension Mother   . Hypertension Brother   . Breast cancer Maternal Grandmother   . Colon cancer Maternal Grandfather   . Breast cancer Paternal Grandmother    Social History   Tobacco Use  . Smoking status: Current Every Day Smoker  . Smokeless tobacco: Never Used  Substance Use Topics  . Alcohol use: Yes  . Drug use: No      Review of Systems Per HPI    Objective:   Physical Exam  Constitutional: She is oriented to person, place, and time. She appears well-developed and well-nourished.  HENT:  Head: Atraumatic.  Cardiovascular: Normal rate.  Pulmonary/Chest: Effort normal. Right breast exhibits no inverted nipple, no mass, no nipple discharge, no skin change and no tenderness. Left breast exhibits tenderness (mild, behind nipple. ). Left breast exhibits no inverted nipple, no mass, no nipple discharge and no skin change.  No abnormalities visualized in sitting or supine position. Thickened tissue palpated posterior to nipple on left.  No discreet masses palpated.   Neurological: She is alert and oriented to person, place, and time.  Skin: Skin is warm and dry.  Psychiatric: She has a normal mood and affect. Her behavior is normal. Judgment and thought content normal.  Vitals reviewed.     BP 108/72 (BP Location: Right Arm, Patient Position: Sitting, Cuff Size: Normal)   Pulse 92   Temp 98.4 F (36.9 C) (Oral)   Ht 5\' 9"  (1.753 m)   Wt 146 lb (66.2 kg)    LMP 10/25/2017   SpO2 97%   BMI 21.56 kg/m  Wt Readings from Last 3 Encounters:  11/08/17 146 lb (66.2 kg)  11/18/16 150 lb (68 kg)  06/18/16 160 lb (72.6 kg)       Assessment & Plan:  1. Breast pain, left - Can take OTC analgesics prn pain - will notify her of imaging results - MM DIAG BREAST TOMO BILATERAL; Future - US BREAST LTD UNI LEFT INC AXILLA; Future - US BREAST LTD UNI RIGHT INC AXILLA; Future  2. Abnormal breast exam - MM DIAG BREAST TOMO BILATERAL; Future - US BREAST LTD UNI LEFT INC AXILLA; Future - US BREAST LTD UNI RIGHT INC AXILLA; Future   Olean Ree, FNP-BC   Primary Care at [facility], [facility]  11/08/2017 5:18 PM

## 2017-11-08 NOTE — Patient Instructions (Signed)
Good to see you today, please stop at desk to get scheduled for a mammogram and ultrasound- they will check both of your breasts even though your pain is on the left  When I get the results, I will send you a mychart message  Can take acetaminophen or ibuprofen per package instructions as needed for pain

## 2017-11-15 ENCOUNTER — Ambulatory Visit
Admission: RE | Admit: 2017-11-15 | Discharge: 2017-11-15 | Disposition: A | Discharge status: Home / Self Care | Payer: Managed Care, Other (non HMO) | Source: Ambulatory Visit | Attending: Family Medicine | Admitting: Family Medicine

## 2017-11-15 DIAGNOSIS — N6459 Other signs and symptoms in breast: Secondary | ICD-10-CM | POA: Insufficient documentation

## 2017-11-15 DIAGNOSIS — N644 Mastodynia: Secondary | ICD-10-CM

## 2018-02-02 NOTE — L&D Delivery Note (Signed)
Date of delivery: 09/30/2018 Estimated Date of Delivery: 09/22/18 No LMP recorded. EGA: [redacted]w[redacted]d  Delivery Note At 8:36 AM a viable female was delivered via Vaginal, Spontaneous (Presentation: OA;  ROA).  APGAR: 7, 9; weight:  3780 g, 8#5oz.   Placenta status: spontaneous, intact.  Cord:  with the following complications: none.  Cord pH: NA  Mom pushed well over 3 contractions following AROM to deliver a viable female infant.  The head followed by posterior shoulder first, with compound right hand, which delivered without difficulty, and the rest of the body.  Nuchal cord noted and reduced after delivery.  Baby to mom's chest.  Cord clamped and cut after 4 min delay.  Cord blood obtained.  Placenta delivered spontaneously, intact, with a 3-vessel cord.   All counts correct.  Hemostasis obtained with IV pitocin and fundal massage.   Anesthesia: epidural  Episiotomy: None Lacerations: right labial abrasion hemostatic Suture Repair: NA Est. Blood Loss (mL): 670  Mom to postpartum.  Baby to Couplet care / Skin to Skin.  Rod Can, CNM 09/30/2018, 9:03 AM

## 2018-02-07 ENCOUNTER — Other Ambulatory Visit (HOSPITAL_COMMUNITY)
Admission: RE | Admit: 2018-02-07 | Discharge: 2018-02-07 | Disposition: A | Discharge status: Home / Self Care | Payer: Managed Care, Other (non HMO) | Source: Ambulatory Visit | Attending: Obstetrics and Gynecology | Admitting: Obstetrics and Gynecology

## 2018-02-07 ENCOUNTER — Encounter: Payer: Self-pay | Admitting: Obstetrics and Gynecology

## 2018-02-07 ENCOUNTER — Ambulatory Visit (INDEPENDENT_AMBULATORY_CARE_PROVIDER_SITE_OTHER): Payer: Managed Care, Other (non HMO) | Admitting: Obstetrics and Gynecology

## 2018-02-07 VITALS — BP 100/58 | Wt 152.0 lb

## 2018-02-07 DIAGNOSIS — Z3201 Encounter for pregnancy test, result positive: Secondary | ICD-10-CM

## 2018-02-07 DIAGNOSIS — O09529 Supervision of elderly multigravida, unspecified trimester: Secondary | ICD-10-CM | POA: Insufficient documentation

## 2018-02-07 DIAGNOSIS — O09899 Supervision of other high risk pregnancies, unspecified trimester: Secondary | ICD-10-CM

## 2018-02-07 DIAGNOSIS — O09523 Supervision of elderly multigravida, third trimester: Secondary | ICD-10-CM

## 2018-02-07 DIAGNOSIS — Z3A01 Less than 8 weeks gestation of pregnancy: Secondary | ICD-10-CM

## 2018-02-07 DIAGNOSIS — O99330 Smoking (tobacco) complicating pregnancy, unspecified trimester: Secondary | ICD-10-CM

## 2018-02-07 DIAGNOSIS — N912 Amenorrhea, unspecified: Secondary | ICD-10-CM

## 2018-02-07 HISTORY — DX: Supervision of elderly multigravida, unspecified trimester: O09.529

## 2018-02-07 HISTORY — DX: Smoking (tobacco) complicating pregnancy, unspecified trimester: O99.330

## 2018-02-07 HISTORY — DX: Supervision of other high risk pregnancies, unspecified trimester: O09.899

## 2018-02-07 LAB — POCT URINALYSIS DIPSTICK OB: Glucose, UA: NEGATIVE

## 2018-02-07 LAB — POCT URINE PREGNANCY: Preg Test, Ur: POSITIVE — AB

## 2018-02-07 NOTE — Progress Notes (Signed)
02/07/2018   Chief Complaint: Missed period  Transfer of Care Patient: no  History of Present Illness: Ms. Koffman is a 38 y.o. [id number] [redacted]w[redacted]d based on No LMP recorded. Patient is pregnant. with an Estimated Date of Delivery: 10/04/18, with the above CC.   Her periods were: regular periods every 28 days She was using no method when she conceived.  She has Negative signs or symptoms of nausea/vomiting of pregnancy. She has Negative signs or symptoms of miscarriage or preterm labor She identifies Negative Zika risk factors for her and her partner On any different medications around the time she conceived/early pregnancy: No  History of varicella: Yes   ROS: A 12-point review of systems was performed and negative, except as stated in the above HPI.  OBGYN History: As per HPI. OB History  Gravida Para Term Preterm AB Living  4 2 2   1 2   SAB TAB Ectopic Multiple Live Births  1       2    # Outcome Date GA Lbr Len/2nd Weight Sex Delivery Anes PTL Lv  4 Current           3 Term 08/20/14    F Vag-Spont   LIV  2 Term 09/18/12    M Vag-Spont   LIV  1 SAB             Any issues with any prior pregnancies: no Any prior children are healthy, doing well, without any problems or issues: yes History of pap smears: Yes. Last pap smear 2018 NIL.  History of STIs: Hx of trichomoniasis    Past Medical History: History reviewed. No pertinent past medical history.  Past Surgical History: History reviewed. No pertinent surgical history.  Family History:  Family History  Problem Relation Age of Onset  . Hypertension Mother   . Hypertension Brother   . Breast cancer Maternal Grandmother   . Colon cancer Maternal Grandfather   . Breast cancer Paternal Grandmother    She denies any female cancers, bleeding or blood clotting disorders.  She denies any history of mental retardation, birth defects or genetic disorders in her or the FOB's history  Social History:  Social History    Socioeconomic History  . Marital status: Single    Spouse name: Not on file  . Number of children: Not on file  . Years of education: Not on file  . Highest education level: Not on file  Occupational History  . Not on file  Social Needs  . Financial resource strain: Not on file  . Food insecurity:    Worry: Not on file    Inability: Not on file  . Transportation needs:    Medical: Not on file    Non-medical: Not on file  Tobacco Use  . Smoking status: Current Every Day Smoker  . Smokeless tobacco: Never Used  Substance and Sexual Activity  . Alcohol use: Yes  . Drug use: No  . Sexual activity: Yes    Birth control/protection: None  Lifestyle  . Physical activity:    Days per week: Not on file    Minutes per session: Not on file  . Stress: Not on file  Relationships  . Social connections:    Talks on phone: Not on file    Gets together: Not on file    Attends religious service: Not on file    Active member of club or organization: Not on file    Attends meetings of clubs or organizations: Not  on file    Relationship status: Not on file  . Intimate partner violence:    Fear of current or ex partner: Not on file    Emotionally abused: Not on file    Physically abused: Not on file    Forced sexual activity: Not on file  Other Topics Concern  . Not on file  Social History Narrative   Engaged.   2 children.   Works as a Chartered certified accountant.   Enjoys spending time with children.     Current smoker, 2 cigarettes a day, trying to quit, is motivated by children and husband to stop  Any pets in the household: no  Knows to not change cat liter  Allergy: No Known Allergies  Current Outpatient Medications:  Current Outpatient Medications:  .  Prenatal MV-Min-FA-Omega-3 (PRENATAL GUMMIES/DHA & FA) 0.4-32.5 MG CHEW, Chew by mouth., Disp: , Rfl:  .  medroxyPROGESTERone (PROVERA) 10 MG tablet, Take 1 tablet (10 mg total) by mouth daily. Use for ten days (Patient not taking:  Reported on 11/08/2017), Disp: 10 tablet, Rfl: 0   Physical Exam:   BP (!) 100/58   Wt 152 lb (68.9 kg)   BMI 22.45 kg/m  Body mass index is 22.45 kg/m. Constitutional: Well nourished, well developed female in no acute distress.  Neck:  Supple, normal appearance, and no thyromegaly  Cardiovascular: S1, S2 normal, no murmur, rub or gallop, regular rate and rhythm Respiratory:  Clear to auscultation bilateral. Normal respiratory effort Abdomen: positive bowel sounds and no masses, hernias; diffusely non tender to palpation, non distended Breasts: breasts appear normal, no suspicious masses, no skin or nipple changes or axillary nodes. Neuro/Psych:  Normal mood and affect.  Skin:  Warm and dry.  Lymphatic:  No inguinal lymphadenopathy.   Pelvic exam: is not limited by body habitus EGBUS: within normal limits, Vagina: within normal limits and with no blood in the vault, Cervix: normal appearing cervix without discharge or lesions, closed/long/high, Uterus:  nonenlarged, and Adnexa:  normal adnexa  Assessment: Ms. Tramonte is a 38 y.o. [id number] [redacted]w[redacted]d based on No LMP recorded. Patient is pregnant. with an Estimated Date of Delivery: 10/04/18,  for prenatal care.  Plan:  1) Avoid alcoholic beverages. 2) Patient encouraged not to smoke.  3) Discontinue the use of all non-medicinal drugs and chemicals.  4) Take prenatal vitamins daily.  5) Seatbelt use advised 6) Nutrition, food safety (fish, cheese advisories, and high nitrite foods) and exercise discussed. 7) Hospital and practice style delivering at [facility] discussed  8) Patient is asked about travel to areas at risk for the Zika virus, and counseled to avoid travel and exposure to mosquitoes or sexual partners who may have themselves been exposed to the virus. Testing is discussed, and will be ordered as appropriate.  9) Childbirth classes at [facility] advised 10) Genetic Screening, such as with 1st Trimester Screening, cell free fetal DNA, AFP  testing, and Ultrasound, as well as with amniocentesis and CVS as appropriate, is discussed with patient. She is undecided about genetic testing this pregnancy. 11) Will return in 2 weeks for ROB and dating [place]   Problem list reviewed and updated.  Adelene Idler MD [facility], [facility] 02/07/2018 2:02 PM

## 2018-02-07 NOTE — Progress Notes (Signed)
NOB C/o some nausea, denies cramping, no spotting October flu shot

## 2018-02-08 LAB — MONITOR DRUG PROFILE 10(MW)
Amphetamine Scrn, Ur: NEGATIVE ng/mL
BARBITURATE SCREEN URINE: NEGATIVE ng/mL
BENZODIAZEPINE SCREEN, URINE: NEGATIVE ng/mL
CANNABINOIDS UR QL SCN: NEGATIVE ng/mL
COCAINE(METAB.)SCREEN, URINE: NEGATIVE ng/mL
Creatinine(Crt), U: 36.4 mg/dL (ref 20.0–300.0)
METHADONE SCREEN, URINE: NEGATIVE ng/mL
OXYCODONE+OXYMORPHONE UR QL SCN: NEGATIVE ng/mL
Opiate Scrn, Ur: NEGATIVE ng/mL
PROPOXYPHENE SCREEN URINE: NEGATIVE ng/mL
Ph of Urine: 6.3 (ref 4.5–8.9)
Phencyclidine Qn, Ur: NEGATIVE ng/mL

## 2018-02-09 LAB — RPR+RH+ABO+RUB AB+AB SCR+CB...
Antibody Screen: NEGATIVE
HEMOGLOBIN: 12.4 g/dL (ref 11.1–15.9)
HEP B S AG: NEGATIVE
HIV SCREEN 4TH GENERATION: NONREACTIVE
Hematocrit: 37.5 % (ref 34.0–46.6)
MCH: 28.2 pg (ref 26.6–33.0)
MCHC: 33.1 g/dL (ref 31.5–35.7)
MCV: 85 fL (ref 79–97)
PLATELETS: 312 10*3/uL (ref 150–450)
RBC: 4.39 x10E6/uL (ref 3.77–5.28)
RDW: 12.3 % (ref 11.7–15.4)
RPR Ser Ql: NONREACTIVE
Rh Factor: POSITIVE
Rubella Antibodies, IGG: 1.59 index (ref >0.99)
VARICELLA: 709 {index} (ref >165)
WBC: 11.3 10*3/uL — AB (ref 3.4–10.8)

## 2018-02-09 LAB — URINE CULTURE: ORGANISM ID, BACTERIA: NO GROWTH

## 2018-02-09 LAB — CERVICOVAGINAL ANCILLARY ONLY
Chlamydia: NEGATIVE
NEISSERIA GONORRHEA: NEGATIVE

## 2018-02-10 NOTE — Progress Notes (Signed)
Normal, Released to [employer]

## 2018-02-21 ENCOUNTER — Ambulatory Visit (INDEPENDENT_AMBULATORY_CARE_PROVIDER_SITE_OTHER): Payer: Managed Care, Other (non HMO)

## 2018-02-21 ENCOUNTER — Ambulatory Visit (INDEPENDENT_AMBULATORY_CARE_PROVIDER_SITE_OTHER): Payer: Managed Care, Other (non HMO) | Admitting: Obstetrics & Gynecology

## 2018-02-21 ENCOUNTER — Other Ambulatory Visit: Payer: Self-pay | Admitting: Obstetrics and Gynecology

## 2018-02-21 VITALS — BP 110/70 | Ht 69.0 in | Wt 152.0 lb

## 2018-02-21 DIAGNOSIS — O09899 Supervision of other high risk pregnancies, unspecified trimester: Secondary | ICD-10-CM

## 2018-02-21 DIAGNOSIS — O09529 Supervision of elderly multigravida, unspecified trimester: Secondary | ICD-10-CM

## 2018-02-21 DIAGNOSIS — Z3A09 9 weeks gestation of pregnancy: Secondary | ICD-10-CM

## 2018-02-21 DIAGNOSIS — O09521 Supervision of elderly multigravida, first trimester: Secondary | ICD-10-CM

## 2018-02-21 LAB — POCT URINALYSIS DIPSTICK OB
GLUCOSE, UA: NEGATIVE
PROTEIN: NEGATIVE

## 2018-02-21 NOTE — Progress Notes (Signed)
  Subjective  Mild nausea, no emesis No pain [place] today  Objective  BP 110/70   Ht 5\' 9"  (1.753 m)   Wt 152 lb (68.9 kg)   BMI 22.45 kg/m  General: NAD Pumonary: no increased work of breathing Abdomen: gravid, non-tender Extremities: no edema Psychiatric: mood appropriate, affect full  Assessment  38 y.o. [id number] at [redacted]w[redacted]d by  09/22/2018, by Ultrasound presenting for routine prenatal visit  Plan   Problem List Items Addressed This Visit      Other   Advanced maternal age in multigravida, unspecified trimester - Primary   Supervision of other high risk pregnancies, unspecified trimester    Other Visit Diagnoses    [redacted] weeks gestation of pregnancy        Review of ULTRASOUND.    I have personally reviewed images and report of recent ultrasound done at [facility].    Plan of management to be discussed with patient. Change EDC based on todays results (09/22/18) Pt still undecided as to genetic testing; counseling today; follow up 2 weeks to determine preference and perform cfDNA if desired Considering PP BTL  Annamarie Major, MD, [facility], [facility] 02/21/2018  9:22 AM

## 2018-02-21 NOTE — Addendum Note (Signed)
Addended by: Cornelius Moras D on: 02/21/2018 09:39 AM   Modules accepted: Orders

## 2018-02-21 NOTE — Patient Instructions (Signed)
Commonly Asked Questions During Pregnancy  Cats: A parasite can be excreted in cat feces.  To avoid exposure you need to have another person empty the [place] box.  If you must empty the litter box you will need to wear gloves.  Wash your hands after handling your cat.  This parasite can also be found in raw or undercooked meat so this should also be avoided.  Colds, Sore Throats, Flu: Please check your medication sheet to see what you can take for symptoms.  If your symptoms are unrelieved by these medications please call the office.  Dental Work: Most any dental work your dentist recommends is permitted.  X-rays should only be taken during the first trimester if absolutely necessary.  Your abdomen should be shielded with a lead apron during all x-rays.  Please notify your provider prior to receiving any x-rays.  Novocaine is fine; gas is not recommended.  If your dentist requires a note from us prior to dental work please call the office and we will provide one for you.  Exercise: Exercise is an important part of staying healthy during your pregnancy.  You may continue most exercises you were accustomed to prior to pregnancy.  Later in your pregnancy you will most likely notice you have difficulty with activities requiring balance like riding a bicycle.  It is important that you listen to your body and avoid activities that put you at a higher risk of falling.  Adequate rest and staying well hydrated are a must!  If you have questions about the safety of specific activities ask your provider.    Exposure to Children with illness: Try to avoid obvious exposure; report any symptoms to us when noted,  If you have chicken pos, red measles or mumps, you should be immune to these diseases.   Please do not take any vaccines while pregnant unless you have checked with your OB provider.  Fetal Movement: After 28 weeks we recommend you do "kick counts" twice daily.  Lie or sit down in a calm quiet environment and  count your baby movements "kicks".  You should feel your baby at least 10 times per hour.  If you have not felt 10 kicks within the first hour get up, walk around and have something sweet to eat or drink then repeat for an additional hour.  If count remains less than 10 per hour notify your provider.  Fumigating: Follow your pest control agent's advice as to how long to stay out of your home.  Ventilate the area well before re-entering.  Hemorrhoids:   Most over-the-counter preparations can be used during pregnancy.  Check your medication to see what is safe to use.  It is important to use a stool softener or fiber in your diet and to drink lots of liquids.  If hemorrhoids seem to be getting worse please call the office.   Hot Tubs:  Hot tubs Jacuzzis and saunas are not recommended while pregnant.  These increase your internal body temperature and should be avoided.  Intercourse:  Sexual intercourse is safe during pregnancy as long as you are comfortable, unless otherwise advised by your provider.  Spotting may occur after intercourse; report any bright red bleeding that is heavier than spotting.  Labor:  If you know that you are in labor, please go to the hospital.  If you are unsure, please call the office and let us help you decide what to do.  Lifting, straining, etc:  If your job requires heavy   lifting or straining please check with your provider for any limitations.  Generally, you should not lift items heavier than that you can lift simply with your hands and arms (no back muscles)  Painting:  Paint fumes do not harm your pregnancy, but may make you ill and should be avoided if possible.  Latex or water based paints have less odor than oils.  Use adequate ventilation while painting.  Permanents & Hair Color:  Chemicals in hair dyes are not recommended as they cause increase hair dryness which can increase hair loss during pregnancy.  " Highlighting" and permanents are allowed.  Dye may be  absorbed differently and permanents may not hold as well during pregnancy.  Sunbathing:  Use a sunscreen, as skin burns easily during pregnancy.  Drink plenty of fluids; avoid over heating.  Tanning Beds:  Because their possible side effects are still unknown, tanning beds are not recommended.  Ultrasound Scans:  Routine ultrasounds are performed at approximately 20 weeks.  You will be able to see your baby's general anatomy an if you would like to know the gender this can usually be determined as well.  If it is questionable when you conceived you may also receive an ultrasound early in your pregnancy for dating purposes.  Otherwise ultrasound exams are not routinely performed unless there is a medical necessity.  Although you can request a scan we ask that you pay for it when conducted because insurance does not cover " patient request" scans.  Work: If your pregnancy proceeds without complications you may work until your due date, unless your physician or employer advises otherwise.  Round Ligament Pain/Pelvic Discomfort:  Sharp, shooting pains not associated with bleeding are fairly common, usually occurring in the second trimester of pregnancy.  They tend to be worse when standing up or when you remain standing for long periods of time.  These are the result of pressure of certain pelvic ligaments called "round ligaments".  Rest, Tylenol and heat seem to be the most effective relief.  As the womb and fetus grow, they rise out of the pelvis and the discomfort improves.  Please notify the office if your pain seems different than that described.  It may represent a more serious condition.   

## 2018-03-07 ENCOUNTER — Ambulatory Visit (INDEPENDENT_AMBULATORY_CARE_PROVIDER_SITE_OTHER): Payer: Managed Care, Other (non HMO) | Admitting: Certified Nurse Midwife

## 2018-03-07 VITALS — BP 100/56 | Wt 153.0 lb

## 2018-03-07 DIAGNOSIS — Z3A11 11 weeks gestation of pregnancy: Secondary | ICD-10-CM

## 2018-03-07 DIAGNOSIS — O09899 Supervision of other high risk pregnancies, unspecified trimester: Secondary | ICD-10-CM

## 2018-03-07 DIAGNOSIS — O09521 Supervision of elderly multigravida, first trimester: Secondary | ICD-10-CM

## 2018-03-07 DIAGNOSIS — O09529 Supervision of elderly multigravida, unspecified trimester: Secondary | ICD-10-CM

## 2018-03-07 DIAGNOSIS — O09891 Supervision of other high risk pregnancies, first trimester: Secondary | ICD-10-CM

## 2018-03-07 LAB — POCT URINALYSIS DIPSTICK OB
Glucose, UA: NEGATIVE
PROTEIN: NEGATIVE

## 2018-03-07 NOTE — Progress Notes (Signed)
No complaints

## 2018-03-09 NOTE — Progress Notes (Signed)
ROB at 11wk4d: Doing well. Tolerating PNV, Desires MaterniT 21 testing today FH at almost 1/2 between U and SP. FHT 161 MaterniT 21 done ROB in 4 weeks.  Farrel Conners, CNM

## 2018-03-12 LAB — MATERNIT 21 PLUS CORE, BLOOD
CHROMOSOME 18: NEGATIVE
CHROMOSOME 21: NEGATIVE
Chromosome 13: NEGATIVE
Fetal Fraction: 8
Y CHROMOSOME: NOT DETECTED

## 2018-03-14 ENCOUNTER — Encounter: Payer: Self-pay | Admitting: Certified Nurse Midwife

## 2018-04-05 ENCOUNTER — Encounter: Payer: Self-pay | Admitting: Maternal Newborn

## 2018-04-05 ENCOUNTER — Ambulatory Visit (INDEPENDENT_AMBULATORY_CARE_PROVIDER_SITE_OTHER): Payer: Managed Care, Other (non HMO) | Admitting: Maternal Newborn

## 2018-04-05 VITALS — BP 104/60 | Wt 159.0 lb

## 2018-04-05 DIAGNOSIS — O09522 Supervision of elderly multigravida, second trimester: Secondary | ICD-10-CM

## 2018-04-05 DIAGNOSIS — O09899 Supervision of other high risk pregnancies, unspecified trimester: Secondary | ICD-10-CM

## 2018-04-05 DIAGNOSIS — Z3689 Encounter for other specified antenatal screening: Secondary | ICD-10-CM

## 2018-04-05 DIAGNOSIS — Z3A15 15 weeks gestation of pregnancy: Secondary | ICD-10-CM

## 2018-04-05 NOTE — Progress Notes (Signed)
ROB C/o round ligament

## 2018-04-05 NOTE — Patient Instructions (Signed)

## 2018-04-05 NOTE — Progress Notes (Signed)
Routine Prenatal Care Visit  Subjective  Alyssa [name] is a 38 y.o. [id number] at [redacted]w[redacted]d being seen today for ongoing prenatal care.  She is currently monitored for the following issues for this high-risk pregnancy and has Preventative health care; Advanced maternal age in multigravida, unspecified trimester; Supervision of other high risk pregnancies, unspecified trimester; and Tobacco use in pregnancy, antepartum on their problem list.  ----------------------------------------------------------------------------------- Patient reports some pain on her right side near her ribs when coughing, sneezing, or getting out of bed.  Vag. Bleeding: None.  No leaking of fluid.  ----------------------------------------------------------------------------------- The following portions of the patient's history were reviewed and updated as appropriate: allergies, current medications, past family history, past medical history, past social history, past surgical history and problem list. Problem list updated.  Objective  Blood pressure 104/60, weight 159 lb (72.1 kg). Pregravid weight 152 lb (68.9 kg) Total Weight Gain 7 lb (3.175 kg) Body mass index is 23.48 kg/m.   Urinalysis: Urine dipstick shows negative for glucose, protein.  Fetal Status: Fetal Heart Rate (bpm): 155         General:  Alert, oriented and cooperative. Patient is in no acute distress.  Skin: Skin is warm and dry. No rash noted.   Cardiovascular: Normal heart rate noted  Respiratory: Normal respiratory effort, no problems with respiration noted  Abdomen: Soft, gravid, appropriate for gestational age. Pain/Pressure: Absent     Pelvic:  Cervical exam deferred        Extremities: Normal range of motion.  Edema: None  Mental Status: Normal mood and affect. Normal behavior. Normal judgment and thought content.    Assessment   38 y.o. [id number] at [redacted]w[redacted]d, EDD 09/22/2018 by Ultrasound presenting for a routine prenatal visit.  Plan    Pregnancy #3 Problems (from 01/11/18 to present)    Problem Noted Resolved   Advanced maternal age in multigravida, unspecified trimester 02/07/2018 by Natale Milch, MD No   Supervision of other high risk pregnancies, unspecified trimester 02/07/2018 by Natale Milch, MD No   Overview Signed 02/07/2018  2:14 PM by Natale Milch, MD      [facility]inic [facility] Prenatal Labs  Dating  Blood type:     Genetic Screen 1 Screen:     AFP:      Quad:      NIPS:    Antibody:   Anatomic [place]  Rubella:   Varicella: @VZVIGG @  GTT Early:        28 wk:      RPR:     Rhogam  HBsAg:     TDaP vaccine                       HIV:     Flu Shot   11/2017                          GBS:   Contraception  Pap: 2018 NIL  CBB     CS/VBAC    Baby Food    Support Person             Tobacco use in pregnancy, antepartum 02/07/2018 by Natale Milch, MD No   Overview Signed 02/07/2018  2:15 PM by Natale Milch, MD    Cut back from 1/2 ppd to 2 cigarettes- would like to totally quit.       Discussed that pain in side is likely musculoskeletal in origin.  Please  refer to After Visit Summary for other counseling recommendations.   Return in about 4 weeks (around 05/03/2018) for ROB and anatomy scan.  Marcelyn Bruins, CNM 04/05/2018

## 2018-04-29 ENCOUNTER — Ambulatory Visit
Admission: RE | Admit: 2018-04-29 | Discharge: 2018-04-29 | Disposition: A | Discharge status: Home / Self Care | Payer: Managed Care, Other (non HMO) | Source: Ambulatory Visit | Attending: Maternal Newborn | Admitting: Maternal Newborn

## 2018-04-29 ENCOUNTER — Other Ambulatory Visit: Payer: Self-pay

## 2018-04-29 DIAGNOSIS — Z3689 Encounter for other specified antenatal screening: Secondary | ICD-10-CM | POA: Diagnosis present

## 2018-04-29 DIAGNOSIS — O09899 Supervision of other high risk pregnancies, unspecified trimester: Secondary | ICD-10-CM | POA: Diagnosis not present

## 2018-05-03 ENCOUNTER — Other Ambulatory Visit: Payer: Self-pay

## 2018-05-03 ENCOUNTER — Ambulatory Visit (INDEPENDENT_AMBULATORY_CARE_PROVIDER_SITE_OTHER): Payer: Managed Care, Other (non HMO) | Admitting: Obstetrics and Gynecology

## 2018-05-03 VITALS — BP 112/60 | Wt 163.0 lb

## 2018-05-03 DIAGNOSIS — Z3A19 19 weeks gestation of pregnancy: Secondary | ICD-10-CM

## 2018-05-03 DIAGNOSIS — O09899 Supervision of other high risk pregnancies, unspecified trimester: Secondary | ICD-10-CM

## 2018-05-03 DIAGNOSIS — O09522 Supervision of elderly multigravida, second trimester: Secondary | ICD-10-CM

## 2018-05-03 DIAGNOSIS — O09529 Supervision of elderly multigravida, unspecified trimester: Secondary | ICD-10-CM

## 2018-05-03 LAB — POCT URINALYSIS DIPSTICK OB
Glucose, UA: NEGATIVE
POC,PROTEIN,UA: NEGATIVE

## 2018-05-03 NOTE — Progress Notes (Signed)
Routine Prenatal Care Visit  Subjective  Alyssa [name] is a 38 y.o. [id number] at [redacted]w[redacted]d being seen today for ongoing prenatal care.  She is currently monitored for the following issues for this high-risk pregnancy and has Preventative health care; Advanced maternal age in multigravida, unspecified trimester; Supervision of other high risk pregnancies, unspecified trimester; and Tobacco use in pregnancy, antepartum on their problem list.  ----------------------------------------------------------------------------------- Patient reports vaginal discharge increased, offwhite to clear.  No odor no itching.   Contractions: Not present. Vag. Bleeding: None.  Movement: Present. Denies leaking of fluid.  ----------------------------------------------------------------------------------- The following portions of the patient's history were reviewed and updated as appropriate: allergies, current medications, past family history, past medical history, past social history, past surgical history and problem list. Problem list updated.   Objective  Blood pressure 112/60, weight 163 lb (73.9 kg). Pregravid weight 152 lb (68.9 kg) Total Weight Gain 11 lb (4.99 kg) Urinalysis:      Fetal Status: Fetal Heart Rate (bpm): 150   Movement: Present     General:  Alert, oriented and cooperative. Patient is in no acute distress.  Skin: Skin is warm and dry. No rash noted.   Cardiovascular: Normal heart rate noted  Respiratory: Normal respiratory effort, no problems with respiration noted  Abdomen: Soft, gravid, appropriate for gestational age. Pain/Pressure: Absent     Pelvic:  Cervical exam deferred        Extremities: Normal range of motion.     ental Status: Normal mood and affect. Normal behavior. Normal judgment and thought content.     Assessment   38 y.o. [id number] at [redacted]w[redacted]d by  09/22/2018, by Ultrasound presenting for routine prenatal visit  Plan   Pregnancy #3 Problems (from 01/11/18 to present)     Problem Noted Resolved   Advanced maternal age in multigravida, unspecified trimester 02/07/2018 by Natale Milch, MD No   Supervision of other high risk pregnancies, unspecified trimester 02/07/2018 by Natale Milch, MD No   Overview Addendum 04/05/2018 10:16 AM by Oswaldo Conroy, CNM      [facility] Prenatal Labs  Dating 9 week [place] Blood type: O/Positive/-- (01/06 1416)   Genetic Screen NIPS: Negative XX Antibody:Negative (01/06 1416)  Anatomic [place] Complete Rubella: 1.59 (01/06 1416) Varicella: Immune  GTT Early:        28 wk:      RPR: Non Reactive (01/06 1416)   Rhogam  HBsAg: Negative (01/06 1416)   TDaP vaccine                       HIV: Non Reactive (01/06 1416)   Flu Shot   11/2017                          GBS:   Contraception  Pap: 2018 NIL  CBB     CS/VBAC    Baby Food    Support Person             Tobacco use in pregnancy, antepartum 02/07/2018 by Natale Milch, MD No   Overview Signed 02/07/2018  2:15 PM by Natale Milch, MD    Cut back from 1/2 ppd to 2 cigarettes- would like to totally quit.          Gestational age appropriate obstetric precautions including but not limited to vaginal bleeding, contractions, leaking of fluid and fetal movement were reviewed in detail with the patient.  Return in about 4 weeks (around 05/31/2018) for phone visit 4 weeks, rob and 28 week labs in 8 weeks.  Vena Austria, MD, [facility], [facility] 05/03/2018, 10:24 AM

## 2018-05-03 NOTE — Progress Notes (Signed)
ROB Anatomy scan at [facility] 3/27 Discharge is heavy no odor

## 2018-05-03 NOTE — Patient Instructions (Signed)
 Hello,  Given the current COVID-19 pandemic, our practice is making changes in how we are providing care to our patients. We are limiting in-person visits for the safety of all of our patients.   As a practice, we have met to discuss the best way to minimize visits, but still provide excellent care to our expecting mothers.  We have decided on the following visit structure for low-risk pregnancies.  Initial Pregnancy visit will be conducted as a telephone or web visit.  Between 10-14 weeks  there will be one in-person visit for an ultrasound, lab work, and genetic screening. 20 weeks in-person visit with an anatomy ultrasound  28 weeks in-person office visit for a 1-hour glucose test and a TDAP vaccination 32 weeks in-person office visit 34 weeks telephone visit 36 weeks in-person office visit for GBS, chlamydia, and gonorrhea testing 38 weeks in-person office visit 40 weeks in-person office visit  Understandably, some patients will require more visits than what is outlined above. Additional visits will be determined on a case-by-case basis.   We will, as always, be available for emergencies or to address concerns that might arise between in-person visits. We ask that you allow us the opportunity to address any concerns over the phone or through a virtual visit first. We will be available to return your phone calls throughout the day.   If you are able to purchase a scale, a blood pressure machine, and a home fetal doppler visits could be limited further. This will help decrease your exposure risks, but these purchases are not a necessity.   Things seem to change daily and there is the possibility that this structure could change, please be patient as we adapt to a new way of caring for patients.   Thank you for trusting us with your prenatal care. Our practice values you and looks forward to providing you with excellent care.   Sincerely,   [facility], [facility]      COVID-19 and Your Pregnancy FAQ  How can I prevent infection with COVID-19 during my pregnancy? Social distancing is key. Please limit any interactions in public. Try and work from home if possible. Frequently wash your hands after touching possibly contaminated surfaces. Avoid touching your face.  Minimize trips to the store. Consider online ordering when possible.   Should I wear a mask? Masks should only be worn by those experiencing symptoms of COVID-19 or those with confirmed COVID-19 when they are in public or around other individuals.  What are the symptoms of COVID-19? Fever (greater than 100.4 F), dry cough, shortness of breath.  Am I more at risk for COVID-19 since I am pregnant? There is not currently data showing that pregnant women are more adversely impacted by COVID-19 than the general population. However, we know that pregnant women tend to have worse respiratory complications from similar diseases such as the flu and SARS and for this reason should be considered an at-risk population.  What do I do if I am experiencing the symptoms of COVID-19? Testing is being limited because of test availability. If you are experiencing symptoms you should quarantine yourself, and the members of your family, for at least 2 weeks at home.   Please visit this website for more information: https://www.cdc.gov/coronavirus/2019-ncov/if-you-are-sick/steps-when-sick.html  When should I go to the Emergency Room? Please go to the emergency room if you are experiencing ANY of these symptoms*:  1.    Difficulty breathing or shortness of breath 2.      Persistent pain or pressure in the chest 3.    Confusion or difficulty being aroused (or awakened) 4.    Bluish lips or face  *This list is not all inclusive. Please consult our office for any other symptoms that are severe or concerning.  What do I do if I am having difficulty breathing? You should go to the Emergency Room for  evaluation. At this time they have a tent set up for evaluating patients with COVID-19 symptoms.   How will my prenatal care be different because of the COVID-19 pandemic? It has been recommended to reduce the frequency of face-to-face visits and use resources such as telephone and virtual visits when possible. Using a scale, blood pressure machine and fetal doppler at home can further help reduce face-to-face visits. You will be provided with additional information on this topic.  We ask that you come to your visits alone to minimize potential exposures to  COVID-19.  How can I receive childbirth education? At this time in-person classes have been cancelled. You can register for online childbirth education, breastfeeding, and newborn care classes.  Please visit:  [email or web address] for more information  How will my hospital birth experience be different? The hospital is currently limiting visitors. This means that while you are in labor you can only have one person at the hospital with you. Additional family members will not be allowed to wait in the building or outside your room. Your one support person can be the father of the baby, a relative, a doula, or a friend. Once one support person is designated that person will wear a band. This band cannot be shared with multiple people.  How long will I stay in the hospital for after giving birth? It is also recommended that discharge home be expedited during the COVID-19 outbreak. This means staying for 1 day after a vaginal delivery and 2 days after a cesarean section.  What if I have COVID-19 and I am in labor? We ask that you wear a mask while on labor and delivery. We will try and accommodate you being placed in a room that is capable of filtering the air. Please call ahead if you are in labor and on your way to the hospital. The phone number for labor and delivery at [facility] is [phone].  If I have  COVID-19 when my baby is born how can I prevent my baby from contracting COVID-19? This is an issue that will have to be discussed on a case-by-case basis. Current recommendations suggest providing separate isolation rooms for both the mother and new infant as well as limiting visitors. However, there are practical challenges to this recommendation. The situation will assuredly change and decisions will be influenced by the desires of the mother and availability of space.  Some suggestions are the use of a curtain or physical barrier between mom and infant, hand hygiene, mom wearing a mask, or 6 feet of spacing between a mom and infant.   Can I breastfeed during the COVID-19 pandemic?   Yes, breastfeeding is encouraged.  Can I breastfeed if I have COVID-19? Yes. Covid-19 has not been found in breast milk. This means you cannot give COVID-19 to your child through breast milk. Breast feeding will also help pass antibodies to fight infection to your baby.   What precautions should I take when breastfeeding if I have COVID-19? If a mother and newborn do room-in and the mother wishes to feed at the breast, she should   facemask and practice hand hygiene before each feeding.  What precautions should I take when pumping if I have COVID-19? Prior to expressing breast milk, mothers should practice hand hygiene. After each pumping session, all parts that come into contact with breast milk should be thoroughly washed and the entire pump should be appropriately disinfected per the manufacturer's instructions. This expressed breast milk should be fed to the newborn by a healthy caregiver.  What if I am pregnant and work in healthcare? Based on limited data regarding COVID-19 and pregnancy, ACOG currently does not propose creating additional restrictions on pregnant health care personnel because of COVID-19 alone. Pregnant women do not appear to be at higher risk of severe disease related to COVID-19. Pregnant health care  personnel should follow CDC risk assessment and infection control guidelines for health care personnel exposed to patients with suspected or confirmed COVID-19. Adherence to recommended infection prevention and control practices is an important part of protecting all health care personnel in health care settings.    Information on COVID-19 in pregnancy is very limited; however, facilities may want to consider limiting exposure of pregnant health care personnel to patients with confirmed or suspected COVID-19 infection, especially during higher-risk procedures (eg, aerosol-generating procedures), if feasible, based on staffing availability.

## 2018-05-31 ENCOUNTER — Ambulatory Visit (INDEPENDENT_AMBULATORY_CARE_PROVIDER_SITE_OTHER): Payer: Managed Care, Other (non HMO) | Admitting: Obstetrics & Gynecology

## 2018-05-31 ENCOUNTER — Other Ambulatory Visit: Payer: Self-pay

## 2018-05-31 ENCOUNTER — Encounter: Payer: Self-pay | Admitting: Obstetrics & Gynecology

## 2018-05-31 DIAGNOSIS — O09899 Supervision of other high risk pregnancies, unspecified trimester: Secondary | ICD-10-CM

## 2018-05-31 DIAGNOSIS — O09522 Supervision of elderly multigravida, second trimester: Secondary | ICD-10-CM

## 2018-05-31 DIAGNOSIS — O09529 Supervision of elderly multigravida, unspecified trimester: Secondary | ICD-10-CM

## 2018-05-31 DIAGNOSIS — Z3A23 23 weeks gestation of pregnancy: Secondary | ICD-10-CM

## 2018-05-31 NOTE — Patient Instructions (Signed)
 COVID-19 and Your Pregnancy FAQ  How can I prevent infection with COVID-19 during my pregnancy? Social distancing is key. Please limit any interactions in public. Try and work from home if possible. Frequently wash your hands after touching possibly contaminated surfaces. Avoid touching your face.  Minimize trips to the store. Consider online ordering when possible.   Should I wear a mask? YES. It is recommended by the CDC that all people wear a cloth mask or facial covering in public. This will help reduce transmission as well as your risk or acquiring COVID-19. New studies are showing that even asymptomatic individuals can spread the virus from talking.   What are the symptoms of COVID-19? Fever (greater than 100.4 F), dry cough, shortness of breath.  Am I more at risk for COVID-19 since I am pregnant? There is not currently data showing that pregnant women are more adversely impacted by COVID-19 than the general population. However, we know that pregnant women tend to have worse respiratory complications from similar diseases such as the flu and SARS and for this reason should be considered an at-risk population.  What do I do if I am experiencing the symptoms of COVID-19? Testing is being limited because of test availability. If you are experiencing symptoms you should quarantine yourself, and the members of your family, for at least 2 weeks at home.   Please visit this website for more information: https://www.cdc.gov/coronavirus/2019-ncov/if-you-are-sick/steps-when-sick.html  When should I go to the Emergency Room? Please go to the emergency room if you are experiencing ANY of these symptoms*:  1.    Difficulty breathing or shortness of breath 2.    Persistent pain or pressure in the chest 3.    Confusion or difficulty being aroused (or awakened) 4.    Bluish lips or face  *This list is not all inclusive. Please consult our office for any other symptoms that are severe or  concerning.  What do I do if I am having difficulty breathing? You should go to the Emergency Room for evaluation. At this time they have a tent set up for evaluating patients with COVID-19 symptoms.   How will my prenatal care be different because of the COVID-19 pandemic? It has been recommended to reduce the frequency of face-to-face visits and use resources such as telephone and virtual visits when possible. Using a scale, blood pressure machine and fetal doppler at home can further help reduce face-to-face visits. You will be provided with additional information on this topic.  We ask that you come to your visits alone to minimize potential exposures to  COVID-19.  How can I receive childbirth education? At this time in-person classes have been cancelled. You can register for online childbirth education, breastfeeding, and newborn care classes.  Please visit:  [email or web address] for more information  How will my hospital birth experience be different? The hospital is currently limiting visitors. This means that while you are in labor you can only have one person at the hospital with you. Additional family members will not be allowed to wait in the building or outside your room. Your one support person can be the father of the baby, a relative, a doula, or a friend. Once one support person is designated that person will wear a band. This band cannot be shared with multiple people.  Nitrous Gas is not being offered for pain relief since the tubing and filter for the machine can not be sanitized in a way to guarantee prevention of transmission of COVID-19.    Nasal cannula use of oxygen for fetal indications has also been discontinued.  Currently a clear plastic sheet is being hung between mom and the delivering provider during pushing and delivery to help prevent transmission of COVID-19.      How long will I stay in the hospital for after giving birth? It is also recommended that  discharge home be expedited during the COVID-19 outbreak. This means staying for 1 day after a vaginal delivery and 2 days after a cesarean section. Patients who need to stay longer for medical reasons are allowed to do so, but the goal will be for expedited discharge home.   What if I have COVID-19 and I am in labor? We ask that you wear a mask while on labor and delivery. We will try and accommodate you being placed in a room that is capable of filtering the air. Please call ahead if you are in labor and on your way to the hospital. The phone number for labor and delivery at [facility] is [phone].  If I have COVID-19 when my baby is born how can I prevent my baby from contracting COVID-19? This is an issue that will have to be discussed on a case-by-case basis. Current recommendations suggest providing separate isolation rooms for both the mother and new infant as well as limiting visitors. However, there are practical challenges to this recommendation. The situation will assuredly change and decisions will be influenced by the desires of the mother and availability of space.  Some suggestions are the use of a curtain or physical barrier between mom and infant, hand hygiene, mom wearing a mask, or 6 feet of spacing between a mom and infant.   Can I breastfeed during the COVID-19 pandemic?   Yes, breastfeeding is encouraged.  Can I breastfeed if I have COVID-19? Yes. Covid-19 has not been found in breast milk. This means you cannot give COVID-19 to your child through breast milk. Breast feeding will also help pass antibodies to fight infection to your baby.   What precautions should I take when breastfeeding if I have COVID-19? If a mother and newborn do room-in and the mother wishes to feed at the breast, she should put on a facemask and practice hand hygiene before each feeding.  What precautions should I take when pumping if I have COVID-19? Prior to expressing  breast milk, mothers should practice hand hygiene. After each pumping session, all parts that come into contact with breast milk should be thoroughly washed and the entire pump should be appropriately disinfected per the manufacturer's instructions. This expressed breast milk should be fed to the newborn by a healthy caregiver.  What if I am pregnant and work in healthcare? Based on limited data regarding COVID-19 and pregnancy, ACOG currently does not propose creating additional restrictions on pregnant health care personnel because of COVID-19 alone. Pregnant women do not appear to be at higher risk of severe disease related to COVID-19. Pregnant health care personnel should follow CDC risk assessment and infection control guidelines for health care personnel exposed to patients with suspected or confirmed COVID-19. Adherence to recommended infection prevention and control practices is an important part of protecting all health care personnel in health care settings.    Information on COVID-19 in pregnancy is very limited; however, facilities may want to consider limiting exposure of pregnant health care personnel to patients with confirmed or suspected COVID-19 infection, especially during higher-risk procedures (eg, aerosol-generating procedures), if feasible, based on staffing availability.     

## 2018-05-31 NOTE — Progress Notes (Signed)
Virtual Visit via Telephone Note  I connected with@ on 05/31/18 at  8:20 AM EDT by telephone and verified that I am speaking with the correct person using two identifiers.   I discussed the limitations, risks, security and privacy concerns of performing an evaluation and management service by telephone and the availability of in person appointments. I also discussed with the patient that there may be a patient responsible charge related to this service. The patient expressed understanding and agreed to proceed.  The patient was at home I spoke with the patient from my  office  Alyssa [name] is a 38 y.o. [id number] at 528w5d being seen today for ongoing prenatal care.  She is currently monitored for the following issues for this low-risk pregnancy and has Preventative health care; Advanced maternal age in multigravida, unspecified trimester; Supervision of other high risk pregnancies, unspecified trimester; and Tobacco use in pregnancy, antepartum on their problem list.  ----------------------------------------------------------------------------------- Patient reports no complaints.  US and labs discussed. Denies pain, VB, leaking of fluid. Good FM. Pt has h/o large babies. ----------------------------------------------------------------------------------- The following portions of the patient's history were reviewed and updated as appropriate: allergies, current medications, past family history, past medical history, past social history, past surgical history and problem list. Problem list updated.   Objective  There were no vitals taken for this visit. Pregravid weight 152 lb (68.9 kg) Total Weight Gain 11 lb (4.99 kg)  Physical Exam could not be performed. Because of the COVID-19 outbreak this visit was performed over the phone and not in person.   Assessment   38 y.o. [id number] at 148w5d by  09/22/2018, by Ultrasound presenting for routine prenatal visit  Plan   Pregnancy #3 Problems (from  01/11/18 to present)    Problem Noted Resolved   Advanced maternal age in multigravida, unspecified trimester    cfDNA normal, XX  No   Supervision of other high risk pregnancies, unspecified trimester    PNV,     Fetal movements and positioning discussed    Discussed all forms of birth control    Plans to breast feed  No   Overview Addendum 05/31/2018  8:49 AM by Nadara MustardHarris, Dunbar Buras P, MD      Clinic [facility] Prenatal Labs  Dating 9 week US Blood type: O/Positive/-- (01/06 1416)   Genetic Screen NIPS: Negative XX Antibody:Negative (01/06 1416)  Anatomic US Complete [facility] Rubella: 1.59 (01/06 1416) Varicella: Immune  GTT 28 wk:      RPR: Non Reactive (01/06 1416)   Rhogam O POS HBsAg: Negative (01/06 1416)   TDaP vaccine                       HIV: Non Reactive (01/06 1416)   Flu Shot   11/2017                          GBS:   Contraception Vasec vs BTL Pap: 2018 NIL  CBB  no   CS/VBAC n/a   Baby Food Breast    Support Person             Tobacco use in pregnancy, antepartum 02/07/2018 by Natale MilchSchuman, Christanna R, MD No   Overview Signed 02/07/2018  2:15 PM by Natale MilchSchuman, Christanna R, MD    Cut back from 1/2 ppd to 2 cigarettes- would like to totally quit. Update, still occas smoking (4/28)         Gestational age appropriate obstetric  precautions including but not limited to vaginal bleeding, contractions, leaking of fluid and fetal movement were reviewed in detail with the patient.     Follow Up Instructions: 4 weeks w labs for glucola   I discussed the assessment and treatment plan with the patient. The patient was provided an opportunity to ask questions and all were answered. The patient agreed with the plan and demonstrated an understanding of the instructions.   The patient was advised to call back or seek an in-person evaluation if the symptoms worsen or if the condition fails to improve as anticipated.  I provided 10 minutes of non-face-to-face time during this  encounter.  Return for Keep appt 5/26 w labs.  Annamarie Major, MD [facility], [facility] 05/31/2018 8:50 AM

## 2018-06-28 ENCOUNTER — Encounter: Payer: Self-pay | Admitting: Obstetrics and Gynecology

## 2018-06-28 ENCOUNTER — Other Ambulatory Visit: Payer: Self-pay

## 2018-06-28 ENCOUNTER — Ambulatory Visit (INDEPENDENT_AMBULATORY_CARE_PROVIDER_SITE_OTHER): Payer: Managed Care, Other (non HMO) | Admitting: Obstetrics and Gynecology

## 2018-06-28 ENCOUNTER — Other Ambulatory Visit: Payer: Managed Care, Other (non HMO)

## 2018-06-28 VITALS — BP 118/74 | Wt 174.0 lb

## 2018-06-28 DIAGNOSIS — O09899 Supervision of other high risk pregnancies, unspecified trimester: Secondary | ICD-10-CM

## 2018-06-28 DIAGNOSIS — O99330 Smoking (tobacco) complicating pregnancy, unspecified trimester: Secondary | ICD-10-CM

## 2018-06-28 DIAGNOSIS — O09529 Supervision of elderly multigravida, unspecified trimester: Secondary | ICD-10-CM

## 2018-06-28 DIAGNOSIS — O09523 Supervision of elderly multigravida, third trimester: Secondary | ICD-10-CM

## 2018-06-28 DIAGNOSIS — Z3A27 27 weeks gestation of pregnancy: Secondary | ICD-10-CM

## 2018-06-28 DIAGNOSIS — O99333 Smoking (tobacco) complicating pregnancy, third trimester: Secondary | ICD-10-CM

## 2018-06-28 NOTE — Progress Notes (Signed)
Routine Prenatal Care Visit  Subjective  Alyssa [name] is a 38 y.o. [id number] at [redacted]w[redacted]d being seen today for ongoing prenatal care.  She is currently monitored for the following issues for this high-risk pregnancy and has Preventative health care; Advanced maternal age in multigravida, unspecified trimester; Supervision of other high risk pregnancies, unspecified trimester; and Tobacco use in pregnancy, antepartum on their problem list.  ----------------------------------------------------------------------------------- Patient reports no complaints.   Contractions: Not present. Vag. Bleeding: None.  Movement: Present. Denies leaking of fluid.  ----------------------------------------------------------------------------------- The following portions of the patient's history were reviewed and updated as appropriate: allergies, current medications, past family history, past medical history, past social history, past surgical history and problem list. Problem list updated.   Objective  Blood pressure 118/74, weight 174 lb (78.9 kg). Pregravid weight 152 lb (68.9 kg) Total Weight Gain 22 lb (9.979 kg) Urinalysis: Urine Protein    Urine Glucose    Fetal Status: Fetal Heart Rate (bpm): 145 Fundal Height: 27 cm Movement: Present     General:  Alert, oriented and cooperative. Patient is in no acute distress.  Skin: Skin is warm and dry. No rash noted.   Cardiovascular: Normal heart rate noted  Respiratory: Normal respiratory effort, no problems with respiration noted  Abdomen: Soft, gravid, appropriate for gestational age. Pain/Pressure: Absent     Pelvic:  Cervical exam deferred        Extremities: Normal range of motion.  Edema: None  Mental Status: Normal mood and affect. Normal behavior. Normal judgment and thought content.   Assessment   38 y.o. [id number] at [redacted]w[redacted]d by  09/22/2018, by Ultrasound presenting for routine prenatal visit  Plan   Pregnancy #3 Problems (from 01/11/18 to present)     Problem Noted Resolved   Advanced maternal age in multigravida, unspecified trimester 02/07/2018 by Natale Milch, MD No   Supervision of other high risk pregnancies, unspecified trimester 02/07/2018 by Natale Milch, MD No   Overview Addendum 05/31/2018  8:49 AM by Nadara Mustard, MD      [facility]inic [facility] Prenatal Labs  Dating 9 week [place] Blood type: O/Positive/-- (01/06 1416)   Genetic Screen NIPS: Negative XX Antibody:Negative (01/06 1416)  Anatomic [place] Complete [facility] Rubella: 1.59 (01/06 1416) Varicella: Immune  GTT 28 wk:      RPR: Non Reactive (01/06 1416)   Rhogam O POS HBsAg: Negative (01/06 1416)   TDaP vaccine                       HIV: Non Reactive (01/06 1416)   Flu Shot   11/2017                          GBS:   Contraception Vasec vs BTL Pap: 2018 NIL  CBB  no   CS/VBAC n/a   Baby Food Breast    Support Person             Tobacco use in pregnancy, antepartum 02/07/2018 by Natale Milch, MD No   Overview Signed 02/07/2018  2:15 PM by Natale Milch, MD    Cut back from 1/2 ppd to 2 cigarettes- would like to totally quit.          Preterm labor symptoms and general obstetric precautions including but not limited to vaginal bleeding, contractions, leaking of fluid and fetal movement were reviewed in detail with the patient. Please refer to After Visit Summary for other  counseling recommendations.   - 28 week labs today  Return in about 2 weeks (around 07/12/2018) for Routine Prenatal Appointment/telephone.  Thomasene MohairStephen Aleph Nickson, MD, Merlinda FrederickFACOG [facility], [facility] 06/28/2018 9:08 AM

## 2018-06-29 LAB — 28 WEEK RH+PANEL
Basophils Absolute: 0.1 10*3/uL (ref 0.0–0.2)
Basos: 1 %
EOS (ABSOLUTE): 0.5 10*3/uL — ABNORMAL HIGH (ref 0.0–0.4)
Eos: 4 %
Gestational Diabetes Screen: 70 mg/dL (ref 65–139)
HIV Screen 4th Generation wRfx: NONREACTIVE
Hematocrit: 33.7 % — ABNORMAL LOW (ref 34.0–46.6)
Hemoglobin: 11.4 g/dL (ref 11.1–15.9)
Immature Grans (Abs): 0 10*3/uL (ref 0.0–0.1)
Immature Granulocytes: 0 %
Lymphocytes Absolute: 1.8 10*3/uL (ref 0.7–3.1)
Lymphs: 16 %
MCH: 30.4 pg (ref 26.6–33.0)
MCHC: 33.8 g/dL (ref 31.5–35.7)
MCV: 90 fL (ref 79–97)
Monocytes Absolute: 0.8 10*3/uL (ref 0.1–0.9)
Monocytes: 7 %
Neutrophils Absolute: 8.5 10*3/uL — ABNORMAL HIGH (ref 1.4–7.0)
Neutrophils: 72 %
Platelets: 206 10*3/uL (ref 150–450)
RBC: 3.75 x10E6/uL — ABNORMAL LOW (ref 3.77–5.28)
RDW: 13.1 % (ref 11.7–15.4)
RPR Ser Ql: NONREACTIVE
WBC: 11.7 10*3/uL — ABNORMAL HIGH (ref 3.4–10.8)

## 2018-07-07 ENCOUNTER — Telehealth: Payer: Self-pay

## 2018-07-07 NOTE — Telephone Encounter (Signed)
Pt calling; coworker's daughter tested positive for Covid-19; coworker tested negative; does she need to get tested.  [phone]  Adv to call [facility] at [phone]

## 2018-07-12 ENCOUNTER — Ambulatory Visit (INDEPENDENT_AMBULATORY_CARE_PROVIDER_SITE_OTHER): Payer: Managed Care, Other (non HMO) | Admitting: Obstetrics & Gynecology

## 2018-07-12 ENCOUNTER — Other Ambulatory Visit: Payer: Self-pay

## 2018-07-12 ENCOUNTER — Encounter: Payer: Self-pay | Admitting: Obstetrics & Gynecology

## 2018-07-12 DIAGNOSIS — O09523 Supervision of elderly multigravida, third trimester: Secondary | ICD-10-CM

## 2018-07-12 DIAGNOSIS — O09529 Supervision of elderly multigravida, unspecified trimester: Secondary | ICD-10-CM

## 2018-07-12 DIAGNOSIS — O09899 Supervision of other high risk pregnancies, unspecified trimester: Secondary | ICD-10-CM

## 2018-07-12 DIAGNOSIS — Z3A29 29 weeks gestation of pregnancy: Secondary | ICD-10-CM

## 2018-07-12 NOTE — Progress Notes (Signed)
Virtual Visit via Telephone Note  I connected with patient on 07/12/18 at 11:20 AM EDT by telephone and verified that I am speaking with the correct person using two identifiers.   I discussed the limitations, risks, security and privacy concerns of performing an evaluation and management service by telephone and the availability of in person appointments. I also discussed with the patient that there may be a patient responsible charge related to this service. The patient expressed understanding and agreed to proceed.  The patient was at home I spoke with the patient from my office  Alyssa [name] is a 38 y.o. [id number] at 5072w5d being seen today for ongoing prenatal care.  She is currently monitored for the following issues for this low-risk pregnancy and has Preventative health care; Advanced maternal age in multigravida, unspecified trimester; Supervision of other high risk pregnancies, unspecified trimester; and Tobacco use in pregnancy, antepartum on their problem list.  ----------------------------------------------------------------------------------- Patient reports backache and heartburn.   Denies pain, VB, leaking of fluid.  ----------------------------------------------------------------------------------- The following portions of the patient's history were reviewed and updated as appropriate: allergies, current medications, past family history, past medical history, past social history, past surgical history and problem list. Problem list updated.   Objective  There were no vitals taken for this visit. Pregravid weight 152 lb (68.9 kg) Total Weight Gain 22 lb (9.979 kg)  Physical Exam could not be performed. Because of the COVID-19 outbreak this visit was performed over the phone and not in person.   Assessment   38 y.o. [id number] at 2972w5d by  09/22/2018, by Ultrasound presenting for routine prenatal visit  Plan   Pregnancy #3 Problems (from 01/11/18 to present)    Problem Noted  Resolved   Advanced maternal age in multigravida, unspecified trimester 02/07/2018 by Natale MilchSchuman, Christanna R, MD No   Supervision of other high risk pregnancies, unspecified trimester 02/07/2018 by Natale MilchSchuman, Christanna R, MD No   Overview Addendum 06/29/2018  8:06 AM by Vena AustriaStaebler, Andreas, MD      [facility]atal [facility]  Dating 9 week US Blood type: O/Positive/-- (01/06 1416)   Genetic Screen NIPS: Negative XX Antibody:Negative (01/06 1416)  Anatomic US Complete [facility] Rubella: 1.59 (01/06 1416) Varicella: Immune  GTT 28 wk: 70 RPR: Non Reactive (01/06 1416)   Rhogam O POS HBsAg: Negative (01/06 1416)   TDaP vaccine                nv       HIV: Non Reactive (01/06 1416)   Flu Shot   11/2017                          GBS: p  Contraception Vasec vs BTL Pap: 2018 NIL  CBB  no   CS/VBAC n/a   Baby Food Breast    Support Person             Tobacco use in pregnancy, antepartum 02/07/2018 by Natale MilchSchuman, Christanna R, MD No   Overview Signed 02/07/2018  2:15 PM by Natale MilchSchuman, Christanna R, MD    Cut back from 1/2 ppd to 2 cigarettes- would like to totally quit. As of today, she has quit (07/12/18)         Gestational age appropriate obstetric precautions including but not limited to vaginal bleeding, contractions, leaking of fluid and fetal movement were reviewed in detail with the patient.     Follow Up Instructions: 2 weeks   I discussed the assessment  and treatment plan with the patient. The patient was provided an opportunity to ask questions and all were answered. The patient agreed with the plan and demonstrated an understanding of the instructions.   The patient was advised to call back or seek an in-person evaluation if the symptoms worsen or if the condition fails to improve as anticipated.  I provided 7 minutes of non-face-to-face time during this encounter.  Return in about 2 weeks (around 07/26/2018) for ROB.  Barnett Applebaum, MD [facility], [facility] 07/12/2018 11:33 AM

## 2018-07-26 ENCOUNTER — Ambulatory Visit (INDEPENDENT_AMBULATORY_CARE_PROVIDER_SITE_OTHER): Payer: Managed Care, Other (non HMO) | Admitting: Obstetrics and Gynecology

## 2018-07-26 ENCOUNTER — Encounter: Payer: Self-pay | Admitting: Obstetrics and Gynecology

## 2018-07-26 ENCOUNTER — Other Ambulatory Visit: Payer: Self-pay

## 2018-07-26 VITALS — BP 102/64 | Wt 172.0 lb

## 2018-07-26 DIAGNOSIS — Z23 Encounter for immunization: Secondary | ICD-10-CM | POA: Diagnosis not present

## 2018-07-26 DIAGNOSIS — O09529 Supervision of elderly multigravida, unspecified trimester: Secondary | ICD-10-CM

## 2018-07-26 DIAGNOSIS — O26843 Uterine size-date discrepancy, third trimester: Secondary | ICD-10-CM

## 2018-07-26 DIAGNOSIS — O09893 Supervision of other high risk pregnancies, third trimester: Secondary | ICD-10-CM

## 2018-07-26 DIAGNOSIS — O09523 Supervision of elderly multigravida, third trimester: Secondary | ICD-10-CM

## 2018-07-26 DIAGNOSIS — Z3A30 30 weeks gestation of pregnancy: Secondary | ICD-10-CM

## 2018-07-26 DIAGNOSIS — O09899 Supervision of other high risk pregnancies, unspecified trimester: Secondary | ICD-10-CM

## 2018-07-26 NOTE — Progress Notes (Signed)
Routine Prenatal Care Visit  Subjective  Alyssa [name] is a 38 y.o. [id number] at [redacted]w[redacted]d being seen today for ongoing prenatal care.  Alyssa [name] is currently monitored for the following issues for this low-risk pregnancy and has Preventative health care; Advanced maternal age in multigravida, unspecified trimester; Supervision of other high risk pregnancies, unspecified trimester; and Tobacco use in pregnancy, antepartum on their problem list.  ----------------------------------------------------------------------------------- Patient reports no complaints.   Contractions: Not present. Vag. Bleeding: None.  Movement: Present. Denies leaking of fluid.  ----------------------------------------------------------------------------------- The following portions of the patient's history were reviewed and updated as appropriate: allergies, current medications, past family history, past medical history, past social history, past surgical history and problem list. Problem list updated.   Objective  Blood pressure 102/64, weight 172 lb (78 kg). Pregravid weight 152 lb (68.9 kg) Total Weight Gain 20 lb (9.072 kg) Urinalysis:      Fetal Status: Fetal Heart Rate (bpm): 149 Fundal Height: 30 cm Movement: Present     General:  Alert, oriented and cooperative. Patient is in no acute distress.  Skin: Skin is warm and dry. No rash noted.   Cardiovascular: Normal heart rate noted  Respiratory: Normal respiratory effort, no problems with respiration noted  Abdomen: Soft, gravid, appropriate for gestational age. Pain/Pressure: Absent     Pelvic:  Cervical exam deferred        Extremities: Normal range of motion.  Edema: None  Mental Status: Normal mood and affect. Normal behavior. Normal judgment and thought content.     Assessment   38 y.o. [id number] at [redacted]w[redacted]d by  09/22/2018, by Ultrasound presenting for routine prenatal visit  Plan   Pregnancy #3 Problems (from 01/11/18 to present)    Problem Noted Resolved    Advanced maternal age in multigravida, unspecified trimester 02/07/2018 by Homero Fellers, MD No   Supervision of other high risk pregnancies, unspecified trimester 02/07/2018 by Homero Fellers, MD No   Overview Addendum 07/26/2018  9:03 AM by Homero Fellers, MD      [facility] P[facility]atal Labs  Dating 9 week [place] Blood type: O/Positive/-- (01/06 1416)   Genetic Screen NIPS: Negative XX Antibody:Negative (01/06 1416)  Anatomic [place] Complete [facility] Rubella: 1.59 (01/06 1416) Varicella: Immune  GTT 28 wk: 70 RPR: Non Reactive (01/06 1416)   Rhogam O POS HBsAg: Negative (01/06 1416)   TDaP vaccine  07/26/2018                     HIV: Non Reactive (01/06 1416)   Flu Shot   11/2017                          GBS:   Contraception Vasec vs BTL Pap: 2018 NIL  CBB  no   CS/VBAC n/a   Baby Food Breast    Support Person             Tobacco use in pregnancy, antepartum 02/07/2018 by Homero Fellers, MD No   Overview Signed 02/07/2018  2:15 PM by Homero Fellers, MD    Cut back from 1/2 ppd to 2 cigarettes- would like to totally quit.          Gestational age appropriate obstetric precautions including but not limited to vaginal bleeding, contractions, leaking of fluid and fetal movement were reviewed in detail with the patient.    Answered questions Signed tubal consent form [place] next visit for uterine size  date discrepancy   Return in about 2 weeks (around 08/09/2018) for ROB in person and US.  Natale Milchhristanna R Oneika Simonian MD [facility], [facility] 07/26/2018, 9:18 AM

## 2018-07-26 NOTE — Progress Notes (Signed)
ROB Tdap/BT consent No concerns Denies lof, no vb, Good FM  

## 2018-08-09 ENCOUNTER — Ambulatory Visit (INDEPENDENT_AMBULATORY_CARE_PROVIDER_SITE_OTHER): Payer: Managed Care, Other (non HMO)

## 2018-08-09 ENCOUNTER — Other Ambulatory Visit: Payer: Self-pay

## 2018-08-09 ENCOUNTER — Ambulatory Visit (INDEPENDENT_AMBULATORY_CARE_PROVIDER_SITE_OTHER): Payer: Managed Care, Other (non HMO) | Admitting: Obstetrics & Gynecology

## 2018-08-09 ENCOUNTER — Encounter: Payer: Self-pay | Admitting: Obstetrics & Gynecology

## 2018-08-09 VITALS — BP 110/70 | Wt 173.0 lb

## 2018-08-09 DIAGNOSIS — O09529 Supervision of elderly multigravida, unspecified trimester: Secondary | ICD-10-CM

## 2018-08-09 DIAGNOSIS — Z3A33 33 weeks gestation of pregnancy: Secondary | ICD-10-CM

## 2018-08-09 DIAGNOSIS — O26843 Uterine size-date discrepancy, third trimester: Secondary | ICD-10-CM | POA: Diagnosis not present

## 2018-08-09 DIAGNOSIS — O99330 Smoking (tobacco) complicating pregnancy, unspecified trimester: Secondary | ICD-10-CM

## 2018-08-09 DIAGNOSIS — O09899 Supervision of other high risk pregnancies, unspecified trimester: Secondary | ICD-10-CM

## 2018-08-09 DIAGNOSIS — O09893 Supervision of other high risk pregnancies, third trimester: Secondary | ICD-10-CM

## 2018-08-09 DIAGNOSIS — O09523 Supervision of elderly multigravida, third trimester: Secondary | ICD-10-CM

## 2018-08-09 LAB — POCT URINALYSIS DIPSTICK OB
Glucose, UA: NEGATIVE
POC,PROTEIN,UA: NEGATIVE

## 2018-08-09 NOTE — Patient Instructions (Signed)
Third Trimester of Pregnancy The third trimester is from week 28 through week 40 (months 7 through 9). The third trimester is a time when the unborn baby (fetus) is growing rapidly. At the end of the ninth month, the fetus is about 20 inches in length and weighs 6-10 pounds. Body changes during your third trimester Your body will continue to go through many changes during pregnancy. The changes vary from woman to woman. During the third trimester:  Your weight will continue to increase. You can expect to gain 25-35 pounds (11-16 kg) by the end of the pregnancy.  You may begin to get stretch marks on your hips, abdomen, and breasts.  You may urinate more often because the fetus is moving lower into your pelvis and pressing on your bladder.  You may develop or continue to have heartburn. This is caused by increased hormones that slow down muscles in the digestive tract.  You may develop or continue to have constipation because increased hormones slow digestion and cause the muscles that push waste through your intestines to relax.  You may develop hemorrhoids. These are swollen veins (varicose veins) in the rectum that can itch or be painful.  You may develop swollen, bulging veins (varicose veins) in your legs.  You may have increased body aches in the pelvis, back, or thighs. This is due to weight gain and increased hormones that are relaxing your joints.  You may have changes in your hair. These can include thickening of your hair, rapid growth, and changes in texture. Some women also have hair loss during or after pregnancy, or hair that feels dry or thin. Your hair will most likely return to normal after your baby is born.  Your breasts will continue to grow and they will continue to become tender. A yellow fluid (colostrum) may leak from your breasts. This is the first milk you are producing for your baby.  Your belly button may stick out.  You may notice more swelling in your hands,  face, or ankles.  You may have increased tingling or numbness in your hands, arms, and legs. The skin on your belly may also feel numb.  You may feel short of breath because of your expanding uterus.  You may have more problems sleeping. This can be caused by the size of your belly, increased need to urinate, and an increase in your body's metabolism.  You may notice the fetus "dropping," or moving lower in your abdomen (lightening).  You may have increased vaginal discharge.  You may notice your joints feel loose and you may have pain around your pelvic bone. What to expect at prenatal visits You will have prenatal exams every 2 weeks until week 36. Then you will have weekly prenatal exams. During a routine prenatal visit:  You will be weighed to make sure you and the baby are growing normally.  Your blood pressure will be taken.  Your abdomen will be measured to track your baby's growth.  The fetal heartbeat will be listened to.  Any test results from the previous visit will be discussed.  You may have a cervical check near your due date to see if your cervix has softened or thinned (effaced).  You will be tested for Group B streptococcus. This happens between 35 and 37 weeks. Your health care provider may ask you:  What your birth plan is.  How you are feeling.  If you are feeling the baby move.  If you have had any abnormal   symptoms, such as leaking fluid, bleeding, severe headaches, or abdominal cramping.  If you are using any tobacco products, including cigarettes, chewing tobacco, and electronic cigarettes.  If you have any questions. Other tests or screenings that may be performed during your third trimester include:  Blood tests that check for low iron levels (anemia).  Fetal testing to check the health, activity level, and growth of the fetus. Testing is done if you have certain medical conditions or if there are problems during the pregnancy.  Nonstress test  (NST). This test checks the health of your baby to make sure there are no signs of problems, such as the baby not getting enough oxygen. During this test, a belt is placed around your belly. The baby is made to move, and its heart rate is monitored during movement. What is false labor? False labor is a condition in which you feel small, irregular tightenings of the muscles in the womb (contractions) that usually go away with rest, changing position, or drinking water. These are called Braxton Hicks contractions. Contractions may last for hours, days, or even weeks before true labor sets in. If contractions come at regular intervals, become more frequent, increase in intensity, or become painful, you should see your health care provider. What are the signs of labor?  Abdominal cramps.  Regular contractions that start at 10 minutes apart and become stronger and more frequent with time.  Contractions that start on the top of the uterus and spread down to the lower abdomen and back.  Increased pelvic pressure and dull back pain.  A watery or bloody mucus discharge that comes from the vagina.  Leaking of amniotic fluid. This is also known as your "water breaking." It could be a slow trickle or a gush. Let your health care provider know if it has a color or strange odor. If you have any of these signs, call your health care provider right away, even if it is before your due date. Follow these instructions at home: Medicines  Follow your health care provider's instructions regarding medicine use. Specific medicines may be either safe or unsafe to take during pregnancy.  Take a prenatal vitamin that contains at least 600 micrograms (mcg) of folic acid.  If you develop constipation, try taking a stool softener if your health care provider approves. Eating and drinking   Eat a balanced diet that includes fresh fruits and vegetables, whole grains, good sources of protein such as meat, eggs, or tofu,  and low-fat dairy. Your health care provider will help you determine the amount of weight gain that is right for you.  Avoid raw meat and uncooked cheese. These carry germs that can cause birth defects in the baby.  If you have low calcium intake from food, talk to your health care provider about whether you should take a daily calcium supplement.  Eat four or five small meals rather than three large meals a day.  Limit foods that are high in fat and processed sugars, such as fried and sweet foods.  To prevent constipation: ? Drink enough fluid to keep your urine clear or pale yellow. ? Eat foods that are high in fiber, such as fresh fruits and vegetables, whole grains, and beans. Activity  Exercise only as directed by your health care provider. Most women can continue their usual exercise routine during pregnancy. Try to exercise for 30 minutes at least 5 days a week. Stop exercising if you experience uterine contractions.  Avoid heavy lifting.  Do   not exercise in extreme heat or humidity, or at high altitudes.  Wear low-heel, comfortable shoes.  Practice good posture.  You may continue to have sex unless your health care provider tells you otherwise. Relieving pain and discomfort  Take frequent breaks and rest with your legs elevated if you have leg cramps or low back pain.  Take warm sitz baths to soothe any pain or discomfort caused by hemorrhoids. Use hemorrhoid cream if your health care provider approves.  Wear a good support bra to prevent discomfort from breast tenderness.  If you develop varicose veins: ? Wear support pantyhose or compression stockings as told by your healthcare provider. ? Elevate your feet for 15 minutes, 3-4 times a day. Prenatal care  Write down your questions. Take them to your prenatal visits.  Keep all your prenatal visits as told by your health care provider. This is important. Safety  Wear your seat belt at all times when driving.  Make  a list of emergency phone numbers, including numbers for family, friends, the hospital, and police and fire departments. General instructions  Avoid cat litter boxes and soil used by cats. These carry germs that can cause birth defects in the baby. If you have a cat, ask someone to clean the litter box for you.  Do not travel far distances unless it is absolutely necessary and only with the approval of your health care provider.  Do not use hot tubs, steam rooms, or saunas.  Do not drink alcohol.  Do not use any products that contain nicotine or tobacco, such as cigarettes and e-cigarettes. If you need help quitting, ask your health care provider.  Do not use any medicinal herbs or unprescribed drugs. These chemicals affect the formation and growth of the baby.  Do not douche or use tampons or scented sanitary pads.  Do not cross your legs for long periods of time.  To prepare for the arrival of your baby: ? Take prenatal classes to understand, practice, and ask questions about labor and delivery. ? Make a trial run to the hospital. ? Visit the hospital and tour the maternity area. ? Arrange for maternity or paternity leave through employers. ? Arrange for family and friends to take care of pets while you are in the hospital. ? Purchase a rear-facing car seat and make sure you know how to install it in your car. ? Pack your hospital bag. ? Prepare the baby's nursery. Make sure to remove all pillows and stuffed animals from the baby's crib to prevent suffocation.  Visit your dentist if you have not gone during your pregnancy. Use a soft toothbrush to brush your teeth and be gentle when you floss. Contact a health care provider if:  You are unsure if you are in labor or if your water has broken.  You become dizzy.  You have mild pelvic cramps, pelvic pressure, or nagging pain in your abdominal area.  You have lower back pain.  You have persistent nausea, vomiting, or diarrhea.   You have an unusual or bad smelling vaginal discharge.  You have pain when you urinate. Get help right away if:  Your water breaks before 37 weeks.  You have regular contractions less than 5 minutes apart before 37 weeks.  You have a fever.  You are leaking fluid from your vagina.  You have spotting or bleeding from your vagina.  You have severe abdominal pain or cramping.  You have rapid weight loss or weight gain.  You have   shortness of breath with chest pain.  You notice sudden or extreme swelling of your face, hands, ankles, feet, or legs.  Your baby makes fewer than 10 movements in 2 hours.  You have severe headaches that do not go away when you take medicine.  You have vision changes. Summary  The third trimester is from week 28 through week 40, months 7 through 9. The third trimester is a time when the unborn baby (fetus) is growing rapidly.  During the third trimester, your discomfort may increase as you and your baby continue to gain weight. You may have abdominal, leg, and back pain, sleeping problems, and an increased need to urinate.  During the third trimester your breasts will keep growing and they will continue to become tender. A yellow fluid (colostrum) may leak from your breasts. This is the first milk you are producing for your baby.  False labor is a condition in which you feel small, irregular tightenings of the muscles in the womb (contractions) that eventually go away. These are called Braxton Hicks contractions. Contractions may last for hours, days, or even weeks before true labor sets in.  Signs of labor can include: abdominal cramps; regular contractions that start at 10 minutes apart and become stronger and more frequent with time; watery or bloody mucus discharge that comes from the vagina; increased pelvic pressure and dull back pain; and leaking of amniotic fluid. This information is not intended to replace advice given to you by your health  care provider. Make sure you discuss any questions you have with your health care provider. Document Released: 01/13/2001 Document Revised: 05/12/2018 Document Reviewed: 02/25/2016 Elsevier Patient Education  2020 [employer]  

## 2018-08-09 NOTE — Addendum Note (Signed)
Addended by: Quintella Baton D on: 08/09/2018 09:46 AM   Modules accepted: Orders

## 2018-08-09 NOTE — Progress Notes (Signed)
  Subjective  Fetal Movement? yes Contractions? no Leaking Fluid? no Vaginal Bleeding? no Has stopped smoking Objective  BP 110/70   Wt 173 lb (78.5 kg)   BMI 25.55 kg/m  General: NAD Pumonary: no increased work of breathing Abdomen: gravid, non-tender Extremities: no edema Psychiatric: mood appropriate, affect full  Assessment  38 y.o. [id number] at [redacted]w[redacted]d by  09/22/2018, by Ultrasound presenting for routine prenatal visit  Plan   Problem List Items Addressed This Visit    Advanced maternal age in multigravida, unspecified trimester    Discussed risks   Supervision of other high risk pregnancies, unspecified trimester   Tobacco use in pregnancy, antepartum    Has quit   [redacted] weeks gestation of pregnancy       PNV, [place] [facility]      Pregnancy #3 Problems (from 01/11/18 to present)    Problem Noted Resolved   Advanced maternal age in multigravida, unspecified trimester 02/07/2018 by Homero Fellers, MD No   Overview Signed 08/09/2018  9:19 AM by Gae Dry, MD    NIPT normal Labor risks discussed      Supervision of other high risk pregnancies, unspecified trimester 02/07/2018 by Homero Fellers, MD No   Overview Addendum 07/26/2018  9:03 AM by Homero Fellers, MD      Clinic [facility] Prenatal Labs  Dating 9 week [place] Blood type: O/Positive/-- (01/06 1416)   Genetic Screen NIPS: Negative XX Antibody:Negative (01/06 1416)  Anatomic [place] Complete [facility] Rubella: 1.59 (01/06 1416) Varicella: Immune  GTT 28 wk: 70 RPR: Non Reactive (01/06 1416)   Rhogam O POS HBsAg: Negative (01/06 1416)   TDaP vaccine  07/26/2018                     HIV: Non Reactive (01/06 1416)   Flu Shot   11/2017                          GBS: nv  Contraception Vasec vs BTL Pap: 2018 NIL  CBB  no   CS/VBAC n/a   Baby Food Breast    Support Person  Husband           Tobacco use in pregnancy, antepartum 02/07/2018 by Homero Fellers, MD No   Overview Addendum 08/09/2018  9:20 AM by Gae Dry, MD    Cut back from 1/2 ppd to 2 cigarettes- would like to totally quit. Update- has quit 08/2018          Barnett Applebaum, MD, [place], [facility] 08/09/2018  9:27 AM

## 2018-08-23 ENCOUNTER — Other Ambulatory Visit: Payer: Self-pay

## 2018-08-23 ENCOUNTER — Encounter: Payer: Self-pay | Admitting: Advanced Practice Midwife

## 2018-08-23 ENCOUNTER — Ambulatory Visit (INDEPENDENT_AMBULATORY_CARE_PROVIDER_SITE_OTHER): Payer: Managed Care, Other (non HMO) | Admitting: Advanced Practice Midwife

## 2018-08-23 VITALS — BP 116/70 | Wt 175.0 lb

## 2018-08-23 DIAGNOSIS — Z3A35 35 weeks gestation of pregnancy: Secondary | ICD-10-CM

## 2018-08-23 DIAGNOSIS — O09523 Supervision of elderly multigravida, third trimester: Secondary | ICD-10-CM

## 2018-08-23 NOTE — Progress Notes (Signed)
No vb. No lof.  

## 2018-08-23 NOTE — Progress Notes (Signed)
Routine Prenatal Care Visit  Subjective  Alyssa [name] is a 38 y.o. [id number] at [redacted]w[redacted]d being seen today for ongoing prenatal care.  She is currently monitored for the following issues for this high-risk pregnancy and has Preventative health care; Advanced maternal age in multigravida, unspecified trimester; Supervision of other high risk pregnancies, unspecified trimester; and Tobacco use in pregnancy, antepartum on their problem list.  ----------------------------------------------------------------------------------- Patient reports no complaints.   Contractions: Not present. Vag. Bleeding: None.  Movement: Present. Denies leaking of fluid.  ----------------------------------------------------------------------------------- The following portions of the patient's history were reviewed and updated as appropriate: allergies, current medications, past family history, past medical history, past social history, past surgical history and problem list. Problem list updated.   Objective  Blood pressure 116/70, weight 175 lb (79.4 kg). Pregravid weight 152 lb (68.9 kg) Total Weight Gain 23 lb (10.4 kg) Urinalysis: Urine Protein    Urine Glucose    Fetal Status: Fetal Heart Rate (bpm): 133 Fundal Height: 36 cm Movement: Present     General:  Alert, oriented and cooperative. Patient is in no acute distress.  Skin: Skin is warm and dry. No rash noted.   Cardiovascular: Normal heart rate noted  Respiratory: Normal respiratory effort, no problems with respiration noted  Abdomen: Soft, gravid, appropriate for gestational age. Pain/Pressure: Absent     Pelvic:  Cervical exam deferred        Extremities: Normal range of motion.  Edema: None  Mental Status: Normal mood and affect. Normal behavior. Normal judgment and thought content.   Assessment   38 y.o. [id number] at [redacted]w[redacted]d by  09/22/2018, by Ultrasound presenting for routine prenatal visit  Plan   Pregnancy #3 Problems (from 01/11/18 to present)     Problem Noted Resolved   Advanced maternal age in multigravida, unspecified trimester 02/07/2018 by Homero Fellers, MD No   Overview Signed 08/09/2018  9:19 AM by Gae Dry, MD    NIPT normal Labor risks discussed      Supervision of other high risk pregnancies, unspecified trimester 02/07/2018 by Homero Fellers, MD No   Overview Addendum 07/26/2018  9:03 AM by Homero Fellers, [place] Prenatal Labs  Dating 9 week [place] Blood type: O/Positive/-- (01/06 1416)   Genetic Screen NIPS: Negative XX Antibody:Negative (01/06 1416)  Anatomic [place] Complete [facility] Rubella: 1.59 (01/06 1416) Varicella: Immune  GTT 28 wk: 70 RPR: Non Reactive (01/06 1416)   Rhogam O POS HBsAg: Negative (01/06 1416)   TDaP vaccine  07/26/2018                     HIV: Non Reactive (01/06 1416)   Flu Shot   11/2017                          GBS:   Contraception Vasec vs BTL Pap: 2018 NIL  CBB  no   CS/VBAC n/a   Baby Food Breast    Support Person             Tobacco use in pregnancy, antepartum 02/07/2018 by Homero Fellers, MD No   Overview Addendum 08/09/2018  9:20 AM by Gae Dry, MD    Cut back from 1/2 ppd to 2 cigarettes- would like to totally quit. Update- has quit 08/2018          Preterm labor symptoms and general obstetric precautions including but not limited to  vaginal bleeding, contractions, leaking of fluid and fetal movement were reviewed in detail with the patient. Please refer to After Visit Summary for other counseling recommendations.  GBS next week  Return in about 1 week (around 08/30/2018) for rob.  Tresea MallJane Charmon Thorson, CNM 08/23/2018 10:30 AM

## 2018-08-30 ENCOUNTER — Other Ambulatory Visit (HOSPITAL_COMMUNITY)
Admission: RE | Admit: 2018-08-30 | Discharge: 2018-08-30 | Disposition: A | Discharge status: Home / Self Care | Payer: Managed Care, Other (non HMO) | Source: Ambulatory Visit | Attending: Maternal Newborn | Admitting: Maternal Newborn

## 2018-08-30 ENCOUNTER — Ambulatory Visit (INDEPENDENT_AMBULATORY_CARE_PROVIDER_SITE_OTHER): Payer: Managed Care, Other (non HMO) | Admitting: Maternal Newborn

## 2018-08-30 ENCOUNTER — Encounter: Payer: Self-pay | Admitting: Maternal Newborn

## 2018-08-30 ENCOUNTER — Other Ambulatory Visit: Payer: Self-pay

## 2018-08-30 VITALS — BP 100/70 | Wt 175.0 lb

## 2018-08-30 DIAGNOSIS — O09523 Supervision of elderly multigravida, third trimester: Secondary | ICD-10-CM

## 2018-08-30 DIAGNOSIS — Z3A36 36 weeks gestation of pregnancy: Secondary | ICD-10-CM

## 2018-08-30 DIAGNOSIS — O09899 Supervision of other high risk pregnancies, unspecified trimester: Secondary | ICD-10-CM | POA: Insufficient documentation

## 2018-08-30 DIAGNOSIS — Z113 Encounter for screening for infections with a predominantly sexual mode of transmission: Secondary | ICD-10-CM | POA: Diagnosis present

## 2018-08-30 DIAGNOSIS — O09893 Supervision of other high risk pregnancies, third trimester: Secondary | ICD-10-CM

## 2018-08-30 NOTE — Progress Notes (Signed)
Routine Prenatal Care Visit  Subjective  Alyssa [name] is a 38 y.o. [id number] at 4139w5d being seen today for ongoing prenatal care.  She is currently monitored for the following issues for this low-risk pregnancy and has Preventative health care; Advanced maternal age in multigravida, unspecified trimester; Supervision of other high risk pregnancies, unspecified trimester; and Tobacco use in pregnancy, antepartum on their problem list.  ----------------------------------------------------------------------------------- Patient reports no complaints.   Contractions: Not present. Vag. Bleeding: None.  Movement: Present. No leaking of fluid.  ----------------------------------------------------------------------------------- The following portions of the patient's history were reviewed and updated as appropriate: allergies, current medications, past family history, past medical history, past social history, past surgical history and problem list. Problem list updated.   Objective  Blood pressure 100/70, weight 175 lb (79.4 kg). Pregravid weight 152 lb (68.9 kg) Total Weight Gain 23 lb (10.4 kg)  Fetal Status: Fetal Heart Rate (bpm): 144 Fundal Height: 37 cm Movement: Present     General:  Alert, oriented and cooperative. Patient is in no acute distress.  Skin: Skin is warm and dry. No rash noted.   Cardiovascular: Normal heart rate noted  Respiratory: Normal respiratory effort, no problems with respiration noted  Abdomen: Soft, gravid, appropriate for gestational age. Pain/Pressure: Absent     Pelvic:  Cervical exam performed Dilation: 1.5 Effacement (%): 50 Station: -3, -2  Extremities: Normal range of motion.  Edema: None  Mental Status: Normal mood and affect. Normal behavior. Normal judgment and thought content.     Assessment   38 y.o. [id number] at 4639w5d, EDD 09/22/2018 by Ultrasound presenting for a routine prenatal visit.  Plan   Pregnancy #3 Problems (from 01/11/18 to present)     Problem Noted Resolved   Advanced maternal age in multigravida, unspecified trimester 02/07/2018 by Natale MilchSchuman, Christanna R, MD No   Overview Signed 08/09/2018  9:19 AM by Nadara MustardHarris, Robert P, MD    NIPT normal Labor risks discussed      Supervision of other high risk pregnancies, unspecified trimester 02/07/2018 by Natale MilchSchuman, Christanna R, MD No   Overview Addendum 07/26/2018  9:03 AM by Natale MilchSchuman, Christanna R, MD      Clinic [facility] Prenatal Labs  Dating 9 week US Blood type: O/Positive/-- (01/06 1416)   Genetic Screen NIPS: Negative XX Antibody:Negative (01/06 1416)  Anatomic US Complete [facility] Rubella: 1.59 (01/06 1416) Varicella: Immune  GTT 28 wk: 70 RPR: Non Reactive (01/06 1416)   Rhogam O POS HBsAg: Negative (01/06 1416)   TDaP vaccine  07/26/2018                     HIV: Non Reactive (01/06 1416)   Flu Shot   11/2017                          GBS:   Contraception Vasec vs BTL Pap: 2018 NIL  CBB  no   CS/VBAC n/a   Baby Food Breast    Support Person             Tobacco use in pregnancy, antepartum 02/07/2018 by Natale MilchSchuman, Christanna R, MD No   Overview Addendum 08/09/2018  9:20 AM by Nadara MustardHarris, Robert P, MD    Cut back from 1/2 ppd to 2 cigarettes- would like to totally quit. Update- has quit 08/2018       GBS/Aptima today.   Preterm labor symptoms and general obstetric precautions including but not limited to vaginal bleeding, contractions,  leaking of fluid and fetal movement were reviewed.  Please refer to After Visit Summary for other counseling recommendations.   Return in about 1 week (around 09/06/2018) for ROB.  Avel Sensor, CNM 08/30/2018  1:28 PM

## 2018-08-30 NOTE — Patient Instructions (Signed)
Third Trimester of Pregnancy The third trimester is from week 28 through week 40 (months 7 through 9). The third trimester is a time when the unborn baby (fetus) is growing rapidly. At the end of the ninth month, the fetus is about 20 inches in length and weighs 6-10 pounds. Body changes during your third trimester Your body will continue to go through many changes during pregnancy. The changes vary from woman to woman. During the third trimester:  Your weight will continue to increase. You can expect to gain 25-35 pounds (11-16 kg) by the end of the pregnancy.  You may begin to get stretch marks on your hips, abdomen, and breasts.  You may urinate more often because the fetus is moving lower into your pelvis and pressing on your bladder.  You may develop or continue to have heartburn. This is caused by increased hormones that slow down muscles in the digestive tract.  You may develop or continue to have constipation because increased hormones slow digestion and cause the muscles that push waste through your intestines to relax.  You may develop hemorrhoids. These are swollen veins (varicose veins) in the rectum that can itch or be painful.  You may develop swollen, bulging veins (varicose veins) in your legs.  You may have increased body aches in the pelvis, back, or thighs. This is due to weight gain and increased hormones that are relaxing your joints.  You may have changes in your hair. These can include thickening of your hair, rapid growth, and changes in texture. Some women also have hair loss during or after pregnancy, or hair that feels dry or thin. Your hair will most likely return to normal after your baby is born.  Your breasts will continue to grow and they will continue to become tender. A yellow fluid (colostrum) may leak from your breasts. This is the first milk you are producing for your baby.  Your belly button may stick out.  You may notice more swelling in your hands,  face, or ankles.  You may have increased tingling or numbness in your hands, arms, and legs. The skin on your belly may also feel numb.  You may feel short of breath because of your expanding uterus.  You may have more problems sleeping. This can be caused by the size of your belly, increased need to urinate, and an increase in your body's metabolism.  You may notice the fetus "dropping," or moving lower in your abdomen (lightening).  You may have increased vaginal discharge.  You may notice your joints feel loose and you may have pain around your pelvic bone. What to expect at prenatal visits You will have prenatal exams every 2 weeks until week 36. Then you will have weekly prenatal exams. During a routine prenatal visit:  You will be weighed to make sure you and the baby are growing normally.  Your blood pressure will be taken.  Your abdomen will be measured to track your baby's growth.  The fetal heartbeat will be listened to.  Any test results from the previous visit will be discussed.  You may have a cervical check near your due date to see if your cervix has softened or thinned (effaced).  You will be tested for Group B streptococcus. This happens between 35 and 37 weeks. Your health care provider may ask you:  What your birth plan is.  How you are feeling.  If you are feeling the baby move.  If you have had any abnormal   symptoms, such as leaking fluid, bleeding, severe headaches, or abdominal cramping.  If you are using any tobacco products, including cigarettes, chewing tobacco, and electronic cigarettes.  If you have any questions. Other tests or screenings that may be performed during your third trimester include:  Blood tests that check for low iron levels (anemia).  Fetal testing to check the health, activity level, and growth of the fetus. Testing is done if you have certain medical conditions or if there are problems during the pregnancy.  Nonstress test  (NST). This test checks the health of your baby to make sure there are no signs of problems, such as the baby not getting enough oxygen. During this test, a belt is placed around your belly. The baby is made to move, and its heart rate is monitored during movement. What is false labor? False labor is a condition in which you feel small, irregular tightenings of the muscles in the womb (contractions) that usually go away with rest, changing position, or drinking water. These are called Braxton Hicks contractions. Contractions may last for hours, days, or even weeks before true labor sets in. If contractions come at regular intervals, become more frequent, increase in intensity, or become painful, you should see your health care provider. What are the signs of labor?  Abdominal cramps.  Regular contractions that start at 10 minutes apart and become stronger and more frequent with time.  Contractions that start on the top of the uterus and spread down to the lower abdomen and back.  Increased pelvic pressure and dull back pain.  A watery or bloody mucus discharge that comes from the vagina.  Leaking of amniotic fluid. This is also known as your "water breaking." It could be a slow trickle or a gush. Let your health care provider know if it has a color or strange odor. If you have any of these signs, call your health care provider right away, even if it is before your due date. Follow these instructions at home: Medicines  Follow your health care provider's instructions regarding medicine use. Specific medicines may be either safe or unsafe to take during pregnancy.  Take a prenatal vitamin that contains at least 600 micrograms (mcg) of folic acid.  If you develop constipation, try taking a stool softener if your health care provider approves. Eating and drinking   Eat a balanced diet that includes fresh fruits and vegetables, whole grains, good sources of protein such as meat, eggs, or tofu,  and low-fat dairy. Your health care provider will help you determine the amount of weight gain that is right for you.  Avoid raw meat and uncooked cheese. These carry germs that can cause birth defects in the baby.  If you have low calcium intake from food, talk to your health care provider about whether you should take a daily calcium supplement.  Eat four or five small meals rather than three large meals a day.  Limit foods that are high in fat and processed sugars, such as fried and sweet foods.  To prevent constipation: ? Drink enough fluid to keep your urine clear or pale yellow. ? Eat foods that are high in fiber, such as fresh fruits and vegetables, whole grains, and beans. Activity  Exercise only as directed by your health care provider. Most women can continue their usual exercise routine during pregnancy. Try to exercise for 30 minutes at least 5 days a week. Stop exercising if you experience uterine contractions.  Avoid heavy lifting.  Do   not exercise in extreme heat or humidity, or at high altitudes.  Wear low-heel, comfortable shoes.  Practice good posture.  You may continue to have sex unless your health care provider tells you otherwise. Relieving pain and discomfort  Take frequent breaks and rest with your legs elevated if you have leg cramps or low back pain.  Take warm sitz baths to soothe any pain or discomfort caused by hemorrhoids. Use hemorrhoid cream if your health care provider approves.  Wear a good support bra to prevent discomfort from breast tenderness.  If you develop varicose veins: ? Wear support pantyhose or compression stockings as told by your healthcare provider. ? Elevate your feet for 15 minutes, 3-4 times a day. Prenatal care  Write down your questions. Take them to your prenatal visits.  Keep all your prenatal visits as told by your health care provider. This is important. Safety  Wear your seat belt at all times when driving.  Make  a list of emergency phone numbers, including numbers for family, friends, the hospital, and police and fire departments. General instructions  Avoid cat litter boxes and soil used by cats. These carry germs that can cause birth defects in the baby. If you have a cat, ask someone to clean the litter box for you.  Do not travel far distances unless it is absolutely necessary and only with the approval of your health care provider.  Do not use hot tubs, steam rooms, or saunas.  Do not drink alcohol.  Do not use any products that contain nicotine or tobacco, such as cigarettes and e-cigarettes. If you need help quitting, ask your health care provider.  Do not use any medicinal herbs or unprescribed drugs. These chemicals affect the formation and growth of the baby.  Do not douche or use tampons or scented sanitary pads.  Do not cross your legs for long periods of time.  To prepare for the arrival of your baby: ? Take prenatal classes to understand, practice, and ask questions about labor and delivery. ? Make a trial run to the hospital. ? Visit the hospital and tour the maternity area. ? Arrange for maternity or paternity leave through employers. ? Arrange for family and friends to take care of pets while you are in the hospital. ? Purchase a rear-facing car seat and make sure you know how to install it in your car. ? Pack your hospital bag. ? Prepare the baby's nursery. Make sure to remove all pillows and stuffed animals from the baby's crib to prevent suffocation.  Visit your dentist if you have not gone during your pregnancy. Use a soft toothbrush to brush your teeth and be gentle when you floss. Contact a health care provider if:  You are unsure if you are in labor or if your water has broken.  You become dizzy.  You have mild pelvic cramps, pelvic pressure, or nagging pain in your abdominal area.  You have lower back pain.  You have persistent nausea, vomiting, or diarrhea.   You have an unusual or bad smelling vaginal discharge.  You have pain when you urinate. Get help right away if:  Your water breaks before 37 weeks.  You have regular contractions less than 5 minutes apart before 37 weeks.  You have a fever.  You are leaking fluid from your vagina.  You have spotting or bleeding from your vagina.  You have severe abdominal pain or cramping.  You have rapid weight loss or weight gain.  You have   shortness of breath with chest pain.  You notice sudden or extreme swelling of your face, hands, ankles, feet, or legs.  Your baby makes fewer than 10 movements in 2 hours.  You have severe headaches that do not go away when you take medicine.  You have vision changes. Summary  The third trimester is from week 28 through week 40, months 7 through 9. The third trimester is a time when the unborn baby (fetus) is growing rapidly.  During the third trimester, your discomfort may increase as you and your baby continue to gain weight. You may have abdominal, leg, and back pain, sleeping problems, and an increased need to urinate.  During the third trimester your breasts will keep growing and they will continue to become tender. A yellow fluid (colostrum) may leak from your breasts. This is the first milk you are producing for your baby.  False labor is a condition in which you feel small, irregular tightenings of the muscles in the womb (contractions) that eventually go away. These are called Braxton Hicks contractions. Contractions may last for hours, days, or even weeks before true labor sets in.  Signs of labor can include: abdominal cramps; regular contractions that start at 10 minutes apart and become stronger and more frequent with time; watery or bloody mucus discharge that comes from the vagina; increased pelvic pressure and dull back pain; and leaking of amniotic fluid. This information is not intended to replace advice given to you by your health  care provider. Make sure you discuss any questions you have with your health care provider. Document Released: 01/13/2001 Document Revised: 05/12/2018 Document Reviewed: 02/25/2016 Elsevier Patient Education  2020 [employer]  

## 2018-08-31 LAB — CERVICOVAGINAL ANCILLARY ONLY
Chlamydia: NEGATIVE
Neisseria Gonorrhea: NEGATIVE

## 2018-09-02 LAB — STREP GP B NAA: Strep Gp B NAA: NEGATIVE

## 2018-09-06 ENCOUNTER — Ambulatory Visit (INDEPENDENT_AMBULATORY_CARE_PROVIDER_SITE_OTHER): Payer: Managed Care, Other (non HMO) | Admitting: Advanced Practice Midwife

## 2018-09-06 ENCOUNTER — Encounter: Payer: Self-pay | Admitting: Advanced Practice Midwife

## 2018-09-06 ENCOUNTER — Other Ambulatory Visit: Payer: Self-pay

## 2018-09-06 VITALS — BP 100/60 | Wt 177.0 lb

## 2018-09-06 DIAGNOSIS — O09523 Supervision of elderly multigravida, third trimester: Secondary | ICD-10-CM

## 2018-09-06 DIAGNOSIS — Z3A37 37 weeks gestation of pregnancy: Secondary | ICD-10-CM

## 2018-09-06 LAB — POCT URINALYSIS DIPSTICK OB
Glucose, UA: NEGATIVE
POC,PROTEIN,UA: NEGATIVE

## 2018-09-06 NOTE — Addendum Note (Signed)
Addended by: Quintella Baton D on: 09/06/2018 02:03 PM   Modules accepted: Orders

## 2018-09-06 NOTE — Progress Notes (Signed)
Routine Prenatal Care Visit  Subjective  Alyssa [name] is a 38 y.o. [id number] at 871w5d being seen today for ongoing prenatal care.  She is currently monitored for the following issues for this high-risk pregnancy and has Preventative health care; Advanced maternal age in multigravida, unspecified trimester; Supervision of other high risk pregnancies, unspecified trimester; and Tobacco use in pregnancy, antepartum on their problem list.  ----------------------------------------------------------------------------------- Patient reports right sided sciatica numbness into her foot and heartburn at night.   Contractions: Not present. Vag. Bleeding: None.  Movement: Present. Denies leaking of fluid.  ----------------------------------------------------------------------------------- The following portions of the patient's history were reviewed and updated as appropriate: allergies, current medications, past family history, past medical history, past social history, past surgical history and problem list. Problem list updated.   Objective  Blood pressure 100/60, weight 177 lb (80.3 kg). Pregravid weight 152 lb (68.9 kg) Total Weight Gain 25 lb (11.3 kg) Urinalysis: Urine Protein    Urine Glucose    Fetal Status: Fetal Heart Rate (bpm): 140 Fundal Height: 37 cm Movement: Present     General:  Alert, oriented and cooperative. Patient is in no acute distress.  Skin: Skin is warm and dry. No rash noted.   Cardiovascular: Normal heart rate noted  Respiratory: Normal respiratory effort, no problems with respiration noted  Abdomen: Soft, gravid, appropriate for gestational age. Pain/Pressure: Present     Pelvic:  Cervical exam deferred        Extremities: Normal range of motion.  Edema: None  Mental Status: Normal mood and affect. Normal behavior. Normal judgment and thought content.   Assessment   38 y.o. [id number] at 3871w5d by  09/22/2018, by Ultrasound presenting for routine prenatal visit  Plan    Pregnancy #3 Problems (from 01/11/18 to present)    Problem Noted Resolved   Advanced maternal age in multigravida, unspecified trimester 02/07/2018 by Natale MilchSchuman, Christanna R, MD No   Overview Signed 08/09/2018  9:19 AM by Nadara MustardHarris, Robert P, MD    NIPT normal Labor risks discussed      Supervision of other high risk pregnancies, unspecified trimester 02/07/2018 by Natale MilchSchuman, Christanna R, MD No   Overview Addendum 07/26/2018  9:03 AM by Natale MilchSchuman, Christanna R, MD      Clinic [facility] Prenatal Labs  Dating 9 week US Blood type: O/Positive/-- (01/06 1416)   Genetic Screen NIPS: Negative XX Antibody:Negative (01/06 1416)  Anatomic US Complete [facility] Rubella: 1.59 (01/06 1416) Varicella: Immune  GTT 28 wk: 70 RPR: Non Reactive (01/06 1416)   Rhogam O POS HBsAg: Negative (01/06 1416)   TDaP vaccine  07/26/2018                     HIV: Non Reactive (01/06 1416)   Flu Shot   11/2017                          GBS:   Contraception Vasec vs BTL Pap: 2018 NIL  CBB  no   CS/VBAC n/a   Baby Food Breast    Support Person             Tobacco use in pregnancy, antepartum 02/07/2018 by Natale MilchSchuman, Christanna R, MD No   Overview Addendum 08/09/2018  9:20 AM by Nadara MustardHarris, Robert P, MD    Cut back from 1/2 ppd to 2 cigarettes- would like to totally quit. Update- has quit 08/2018          Preterm labor symptoms  and general obstetric precautions including but not limited to vaginal bleeding, contractions, leaking of fluid and fetal movement were reviewed in detail with the patient. Please refer to After Visit Summary for other counseling recommendations.  Sciatica: heat/ice, stretches, hands and knees, tub soaks, abdominal support band Heartburn: small frequent meals, sit up after eating, milk, diluted apple cider vinegar, TUMS  Return in about 1 week (around 09/13/2018) for rob.  Rod Can, CNM 09/06/2018 1:58 PM

## 2018-09-06 NOTE — Patient Instructions (Signed)
Braxton Hicks Contractions Contractions of the uterus can occur throughout pregnancy, but they are not always a sign that you are in labor. You may have practice contractions called Braxton Hicks contractions. These false labor contractions are sometimes confused with true labor. What are Braxton Hicks contractions? Braxton Hicks contractions are tightening movements that occur in the muscles of the uterus before labor. Unlike true labor contractions, these contractions do not result in opening (dilation) and thinning of the cervix. Toward the end of pregnancy (32-34 weeks), Braxton Hicks contractions can happen more often and may become stronger. These contractions are sometimes difficult to tell apart from true labor because they can be very uncomfortable. You should not feel embarrassed if you go to the hospital with false labor. Sometimes, the only way to tell if you are in true labor is for your health care provider to look for changes in the cervix. The health care provider will do a physical exam and may monitor your contractions. If you are not in true labor, the exam should show that your cervix is not dilating and your water has not broken. If there are no other health problems associated with your pregnancy, it is completely safe for you to be sent home with false labor. You may continue to have Braxton Hicks contractions until you go into true labor. How to tell the difference between true labor and false labor True labor  Contractions last 30-70 seconds.  Contractions become very regular.  Discomfort is usually felt in the top of the uterus, and it spreads to the lower abdomen and low back.  Contractions do not go away with walking.  Contractions usually become more intense and increase in frequency.  The cervix dilates and gets thinner. False labor  Contractions are usually shorter and not as strong as true labor contractions.  Contractions are usually irregular.  Contractions  are often felt in the front of the lower abdomen and in the groin.  Contractions may go away when you walk around or change positions while lying down.  Contractions get weaker and are shorter-lasting as time goes on.  The cervix usually does not dilate or become thin. Follow these instructions at home:   Take over-the-counter and prescription medicines only as told by your health care provider.  Keep up with your usual exercises and follow other instructions from your health care provider.  Eat and drink lightly if you think you are going into labor.  If Braxton Hicks contractions are making you uncomfortable: ? Change your position from lying down or resting to walking, or change from walking to resting. ? Sit and rest in a tub of warm water. ? Drink enough fluid to keep your urine pale yellow. Dehydration may cause these contractions. ? Do slow and deep breathing several times an hour.  Keep all follow-up prenatal visits as told by your health care provider. This is important. Contact a health care provider if:  You have a fever.  You have continuous pain in your abdomen. Get help right away if:  Your contractions become stronger, more regular, and closer together.  You have fluid leaking or gushing from your vagina.  You pass blood-tinged mucus (bloody show).  You have bleeding from your vagina.  You have low back pain that you never had before.  You feel your baby's head pushing down and causing pelvic pressure.  Your baby is not moving inside you as much as it used to. Summary  Contractions that occur before labor are   called Braxton Hicks contractions, false labor, or practice contractions.  Braxton Hicks contractions are usually shorter, weaker, farther apart, and less regular than true labor contractions. True labor contractions usually become progressively stronger and regular, and they become more frequent.  Manage discomfort from Braxton Hicks contractions  by changing position, resting in a warm bath, drinking plenty of water, or practicing deep breathing. This information is not intended to replace advice given to you by your health care provider. Make sure you discuss any questions you have with your health care provider. Document Released: 06/04/2016 Document Revised: 01/01/2017 Document Reviewed: 06/04/2016 Elsevier Patient Education  2020 [employer]  

## 2018-09-16 ENCOUNTER — Other Ambulatory Visit: Payer: Self-pay

## 2018-09-16 ENCOUNTER — Ambulatory Visit (INDEPENDENT_AMBULATORY_CARE_PROVIDER_SITE_OTHER): Payer: Managed Care, Other (non HMO) | Admitting: Maternal Newborn

## 2018-09-16 ENCOUNTER — Encounter: Payer: Self-pay | Admitting: Maternal Newborn

## 2018-09-16 VITALS — BP 110/70 | Wt 175.2 lb

## 2018-09-16 DIAGNOSIS — Z3A39 39 weeks gestation of pregnancy: Secondary | ICD-10-CM

## 2018-09-16 DIAGNOSIS — O09899 Supervision of other high risk pregnancies, unspecified trimester: Secondary | ICD-10-CM

## 2018-09-16 DIAGNOSIS — O09523 Supervision of elderly multigravida, third trimester: Secondary | ICD-10-CM

## 2018-09-16 LAB — POCT URINALYSIS DIPSTICK OB
Glucose, UA: NEGATIVE
POC,PROTEIN,UA: NEGATIVE

## 2018-09-16 NOTE — Progress Notes (Signed)
ROB- pt would like cervix check

## 2018-09-16 NOTE — Progress Notes (Signed)
Routine Prenatal Care Visit  Subjective  Alyssa [name] is a 38 y.o. [id number] at 7444w1d being seen today for ongoing prenatal care.  She is currently monitored for the following issues for this high-risk pregnancy and has Advanced maternal age in multigravida, unspecified trimester and Supervision of other high risk pregnancies, unspecified trimester on their problem list.  ----------------------------------------------------------------------------------- Patient reports heartburn and some Braxton Hicks contractions.   Contractions: Not present. Vag. Bleeding: None.  Movement: Present. No leaking of fluid.  ----------------------------------------------------------------------------------- The following portions of the patient's history were reviewed and updated as appropriate: allergies, current medications, past family history, past medical history, past social history, past surgical history and problem list. Problem list updated.   Objective  Blood pressure 110/70, weight 175 lb 3.2 oz (79.5 kg). Pregravid weight 152 lb (68.9 kg) Total Weight Gain 23 lb 3.2 oz (10.5 kg) Urinalysis: Urine dipstick shows negative for glucose, protein.  Fetal Status: Fetal Heart Rate (bpm): 138 Fundal Height: 37 cm Movement: Present  Presentation: Vertex  General:  Alert, oriented and cooperative. Patient is in no acute distress.  Skin: Skin is warm and dry. No rash noted.   Cardiovascular: Normal heart rate noted  Respiratory: Normal respiratory effort, no problems with respiration noted  Abdomen: Soft, gravid, appropriate for gestational age. Pain/Pressure: Present     Pelvic:  Cervical exam performed Dilation: 1.5 Effacement (%): 50 Station: -2  Extremities: Normal range of motion.  Edema: None  Mental Status: Normal mood and affect. Normal behavior. Normal judgment and thought content.     Assessment   38 y.o. [id number] at 2344w1d, EDD 09/22/2018 by Ultrasound presenting for a routine prenatal  visit.  Plan   Pregnancy #3 Problems (from 01/11/18 to present)    Problem Noted Resolved   Advanced maternal age in multigravida, unspecified trimester 02/07/2018 by Natale MilchSchuman, Christanna R, MD No   Overview Signed 08/09/2018  9:19 AM by Nadara MustardHarris, Robert P, MD    NIPT normal Labor risks discussed      Supervision of other high risk pregnancies, unspecified trimester 02/07/2018 by Natale MilchSchuman, Christanna R, MD No   Overview Addendum 07/26/2018  9:03 AM by Natale MilchSchuman, Christanna R, MD      Clinic [facility] Prenatal Labs  Dating 9 week US Blood type: O/Positive/-- (01/06 1416)   Genetic Screen NIPS: Negative XX Antibody:Negative (01/06 1416)  Anatomic US Complete [facility] Rubella: 1.59 (01/06 1416) Varicella: Immune  GTT 28 wk: 70 RPR: Non Reactive (01/06 1416)   Rhogam O POS HBsAg: Negative (01/06 1416)   TDaP vaccine  07/26/2018                     HIV: Non Reactive (01/06 1416)   Flu Shot   11/2017                          GBS:   Contraception Vasec vs BTL Pap: 2018 NIL  CBB  no   CS/VBAC n/a   Baby Food Breast    Support Person             Tobacco use in pregnancy, antepartum 02/07/2018 by Natale MilchSchuman, Christanna R, MD No   Overview Addendum 08/09/2018  9:20 AM by Nadara MustardHarris, Robert P, MD    Cut back from 1/2 ppd to 2 cigarettes- would like to totally quit. Update- has quit 08/2018       Prefers expectant management. Attempted membrane sweep today at patient's request.  Term  labor symptoms and general obstetric precautions including but not limited to vaginal bleeding, contractions, leaking of fluid and fetal movement were reviewed.  Please refer to After Visit Summary for other counseling recommendations.   Return in about 1 week (around 09/23/2018) for [place].  Avel Sensor, CNM 09/16/2018  3:21 PM

## 2018-09-16 NOTE — Patient Instructions (Signed)

## 2018-09-20 ENCOUNTER — Encounter: Payer: Managed Care, Other (non HMO) | Admitting: Maternal Newborn

## 2018-09-23 ENCOUNTER — Other Ambulatory Visit: Payer: Self-pay

## 2018-09-23 ENCOUNTER — Ambulatory Visit (INDEPENDENT_AMBULATORY_CARE_PROVIDER_SITE_OTHER): Payer: Managed Care, Other (non HMO) | Admitting: Advanced Practice Midwife

## 2018-09-23 ENCOUNTER — Encounter: Payer: Managed Care, Other (non HMO) | Admitting: Advanced Practice Midwife

## 2018-09-23 ENCOUNTER — Encounter: Payer: Self-pay | Admitting: Advanced Practice Midwife

## 2018-09-23 VITALS — BP 112/68 | Wt 173.0 lb

## 2018-09-23 DIAGNOSIS — Z3A40 40 weeks gestation of pregnancy: Secondary | ICD-10-CM

## 2018-09-23 DIAGNOSIS — O480 Post-term pregnancy: Secondary | ICD-10-CM

## 2018-09-23 DIAGNOSIS — Z01812 Encounter for preprocedural laboratory examination: Secondary | ICD-10-CM

## 2018-09-23 NOTE — Progress Notes (Signed)
Routine Prenatal Care Visit  Subjective  Alyssa [name] is a 38 y.o. [id number] at [redacted]w[redacted]d being seen today for ongoing prenatal care.  She is currently monitored for the following issues for this high-risk pregnancy and has Advanced maternal age in multigravida, unspecified trimester and Supervision of other high risk pregnancies, unspecified trimester on their problem list.  ----------------------------------------------------------------------------------- Patient reports ready to have baby! She had some contractions after sweep last week and occasional contractions throughout the week. She is trying everything she can think of- encouraged her not to use castor oil which she hadn't planned to.    Contractions: Irregular.  .  Movement: Present. Denies leaking of fluid.  ----------------------------------------------------------------------------------- The following portions of the patient's history were reviewed and updated as appropriate: allergies, current medications, past family history, past medical history, past social history, past surgical history and problem list. Problem list updated.   Objective  Blood pressure 112/68, weight 173 lb (78.5 kg). Pregravid weight 152 lb (68.9 kg) Total Weight Gain 21 lb (9.526 kg) Urinalysis: Urine Protein    Urine Glucose    Fetal Status: Fetal Heart Rate (bpm): 132 Fundal Height: 39 cm Movement: Present  Presentation: Vertex  General:  Alert, oriented and cooperative. Patient is in no acute distress.  Skin: Skin is warm and dry. No rash noted.   Cardiovascular: Normal heart rate noted  Respiratory: Normal respiratory effort, no problems with respiration noted  Abdomen: Soft, gravid, appropriate for gestational age. Pain/Pressure: Present     Pelvic:  Cervical exam performed Dilation: 3 Effacement (%): 60 Station: -2, cervix swept  Extremities: Normal range of motion.  Edema: None  Mental Status: Normal mood and affect. Normal behavior. Normal  judgment and thought content.   Assessment   38 y.o. [id number] at [redacted]w[redacted]d by  37/20/2020, by Ultrasound presenting for routine prenatal visit  Plan   Pregnancy #3 Problems (from 37/10/19 to present)    Problem Noted Resolved   Advanced maternal age in multigravida, unspecified trimester 37/07/2018 by Homero Fellers, MD No   Overview Signed 37/08/2018  9:19 AM by Gae Dry, MD    NIPT normal Labor risks discussed      Supervision of other high risk pregnancies, unspecified trimester 37/07/2018 by Homero Fellers, MD No   Overview Addendum 37/23/2020  9:03 AM by Homero Fellers, [place] Prenatal Labs  Dating 9 week [place] Blood type: O/Positive/-- (01/06 1416)   Genetic Screen NIPS: Negative XX Antibody:Negative (01/06 1416)  Anatomic [place] Complete [facility] Rubella: 1.59 (01/06 1416) Varicella: Immune  GTT 28 wk: 70 RPR: Non Reactive (01/06 1416)   Rhogam O POS HBsAg: Negative (01/06 1416)   TDaP vaccine  07/26/2018                     HIV: Non Reactive (01/06 1416)   Flu Shot   11/2017                          GBS:   Contraception Vasec vs BTL Pap: 2018 NIL  CBB  no   CS/VBAC n/a   Baby Food Breast    Support Person             Tobacco use in pregnancy, antepartum 37/07/2018 by Homero Fellers, MD 37/14/2020 by Rexene Agent, CNM   Overview Addendum 37/08/2018  9:20 AM by Gae Dry, MD    Cut back from  1/2 ppd to 2 cigarettes- would like to totally quit. Update- has quit 08/2018          Term labor symptoms and general obstetric precautions including but not limited to vaginal bleeding, contractions, leaking of fluid and fetal movement were reviewed in detail with the patient. Will call patient with IOL time/date   Return for iol in 1 week.  Tresea MallJane Schneider [place], CNM 09/23/2018 3:56 PM

## 2018-09-23 NOTE — Progress Notes (Signed)
ROB C/o some cramping Denies lof, no vb, Good FM

## 2018-09-24 NOTE — Addendum Note (Signed)
Addended by: Rod Can on: 09/24/2018 05:33 PM   Modules accepted: Orders, SmartSet

## 2018-09-28 ENCOUNTER — Other Ambulatory Visit
Admission: RE | Admit: 2018-09-28 | Discharge: 2018-09-28 | Disposition: A | Discharge status: Home / Self Care | Payer: Managed Care, Other (non HMO) | Source: Ambulatory Visit | Attending: Obstetrics & Gynecology | Admitting: Obstetrics & Gynecology

## 2018-09-28 ENCOUNTER — Telehealth: Payer: Self-pay

## 2018-09-28 ENCOUNTER — Other Ambulatory Visit: Payer: Self-pay

## 2018-09-28 DIAGNOSIS — Z20828 Contact with and (suspected) exposure to other viral communicable diseases: Secondary | ICD-10-CM | POA: Diagnosis not present

## 2018-09-28 DIAGNOSIS — Z01812 Encounter for preprocedural laboratory examination: Secondary | ICD-10-CM | POA: Diagnosis not present

## 2018-09-28 LAB — SARS CORONAVIRUS 2 (TAT 6-24 HRS): SARS Coronavirus 2: NEGATIVE

## 2018-09-28 NOTE — Telephone Encounter (Signed)
I called and spoke to the patient. BTW, we don't have Cervidil anymore..Birth weight not on file generally now use Cytotec for ripening. [employer]

## 2018-09-28 NOTE — Telephone Encounter (Signed)
Spoke w/pt. 1)She is inquiring where she goes to check in? Her induction is at midnight. 2)She also wants to know what her first step of induction is? 3)Will she be able to get an Epidural? 4)How long does the process take? 1)ED per L&D 2)Explained that she is likely to be given cervadil to help soften/dilate her cervix 3)Yes, Epidural is available to her 4)Advised timing all depends on how she progresses. She/baby will be monitored. Once her water breaks or is broken, it will be 24 hours or less (either vaginally or by c-section) d/t risk of infection/dry birth. At any point during labor if she isn't progressing or if baby becomes distress, provider will discuss with her if c-section is the only/best option.

## 2018-09-28 NOTE — Telephone Encounter (Signed)
LMVM TRC. Advised to ask to speak to [place]. If presses option for advise nurse, she will get voice mail. Can leave questions on vm and we will return call with answers.

## 2018-09-28 NOTE — Telephone Encounter (Signed)
Patient has questions about induction as she has not been induced before. ES#[phone]

## 2018-09-30 ENCOUNTER — Other Ambulatory Visit: Payer: Self-pay

## 2018-09-30 ENCOUNTER — Encounter: Payer: Self-pay | Admitting: *Deleted

## 2018-09-30 ENCOUNTER — Inpatient Hospital Stay: Payer: Managed Care, Other (non HMO) | Admitting: Anesthesiology

## 2018-09-30 ENCOUNTER — Inpatient Hospital Stay
Admission: EM | Admit: 2018-09-30 | Discharge: 2018-10-01 | DRG: 807 | Disposition: A | Discharge status: Home / Self Care | Payer: Managed Care, Other (non HMO) | Attending: Obstetrics & Gynecology | Admitting: Obstetrics & Gynecology

## 2018-09-30 DIAGNOSIS — Z3A41 41 weeks gestation of pregnancy: Secondary | ICD-10-CM | POA: Diagnosis not present

## 2018-09-30 DIAGNOSIS — O09529 Supervision of elderly multigravida, unspecified trimester: Secondary | ICD-10-CM

## 2018-09-30 DIAGNOSIS — O480 Post-term pregnancy: Secondary | ICD-10-CM | POA: Diagnosis present

## 2018-09-30 DIAGNOSIS — O09899 Supervision of other high risk pregnancies, unspecified trimester: Secondary | ICD-10-CM

## 2018-09-30 LAB — CBC
HCT: 31.8 % — ABNORMAL LOW (ref 36.0–46.0)
Hemoglobin: 10.5 g/dL — ABNORMAL LOW (ref 12.0–15.0)
MCH: 29.5 pg (ref 26.0–34.0)
MCHC: 33 g/dL (ref 30.0–36.0)
MCV: 89.3 fL (ref 80.0–100.0)
Platelets: 203 10*3/uL (ref 150–400)
RBC: 3.56 MIL/uL — ABNORMAL LOW (ref 3.87–5.11)
RDW: 13.2 % (ref 11.5–15.5)
WBC: 11.7 10*3/uL — ABNORMAL HIGH (ref 4.0–10.5)
nRBC: 0 % (ref 0.0–0.2)

## 2018-09-30 LAB — TYPE AND SCREEN
ABO/RH(D): O POS
Antibody Screen: NEGATIVE

## 2018-09-30 MED ORDER — AMMONIA AROMATIC IN INHA
RESPIRATORY_TRACT | Status: AC
  Filled 2018-09-30: qty 10

## 2018-09-30 MED ORDER — ONDANSETRON HCL 4 MG/2ML IJ SOLN: 4.0000 mg | Freq: Four times a day (QID) | INTRAMUSCULAR | Status: DC | PRN

## 2018-09-30 MED ORDER — BUTORPHANOL TARTRATE 1 MG/ML IJ SOLN: 1.0000 mg | INTRAMUSCULAR | Status: DC | PRN

## 2018-09-30 MED ORDER — FENTANYL 2.5 MCG/ML W/ROPIVACAINE 0.15% IN NS 100 ML EPIDURAL (ARMC)
12.0000 mL/h | EPIDURAL | Status: DC
  Administered 2018-09-30: 06:00:00 12 mL/h via EPIDURAL

## 2018-09-30 MED ORDER — ONDANSETRON HCL 4 MG PO TABS: 4.0000 mg | ORAL_TABLET | ORAL | Status: DC | PRN

## 2018-09-30 MED ORDER — SODIUM CHLORIDE 0.9 % IV SOLN
INTRAVENOUS | Status: DC | PRN
  Administered 2018-09-30 (×2): 5 mL via EPIDURAL

## 2018-09-30 MED ORDER — FENTANYL 2.5 MCG/ML W/ROPIVACAINE 0.15% IN NS 100 ML EPIDURAL (ARMC)
EPIDURAL | Status: AC
  Filled 2018-09-30: qty 100

## 2018-09-30 MED ORDER — OXYTOCIN 40 UNITS IN NORMAL SALINE INFUSION - SIMPLE MED
2.5000 [IU]/h | INTRAVENOUS | Status: DC
  Administered 2018-09-30: 09:00:00 2.5 [IU]/h via INTRAVENOUS

## 2018-09-30 MED ORDER — OXYTOCIN 40 UNITS IN NORMAL SALINE INFUSION - SIMPLE MED
INTRAVENOUS | Status: AC
  Filled 2018-09-30: qty 1000

## 2018-09-30 MED ORDER — MISOPROSTOL 200 MCG PO TABS
ORAL_TABLET | ORAL | Status: AC
  Filled 2018-09-30: qty 4

## 2018-09-30 MED ORDER — ONDANSETRON HCL 4 MG/2ML IJ SOLN: 4.0000 mg | INTRAMUSCULAR | Status: DC | PRN

## 2018-09-30 MED ORDER — LACTATED RINGERS IV SOLN
500.0000 mL | Freq: Once | INTRAVENOUS | Status: AC
  Administered 2018-09-30: 06:00:00 500 mL via INTRAVENOUS

## 2018-09-30 MED ORDER — ACETAMINOPHEN 325 MG PO TABS: 650.0000 mg | ORAL_TABLET | ORAL | Status: DC | PRN

## 2018-09-30 MED ORDER — LIDOCAINE HCL (PF) 1 % IJ SOLN
INTRAMUSCULAR | Status: DC | PRN
  Administered 2018-09-30: 1 mL via INTRADERMAL
  Administered 2018-09-30: 3 mL via EPIDURAL

## 2018-09-30 MED ORDER — LACTATED RINGERS IV SOLN
INTRAVENOUS | Status: DC
  Administered 2018-09-30: 04:00:00 via INTRAVENOUS

## 2018-09-30 MED ORDER — PRENATAL MULTIVITAMIN CH
1.0000 | ORAL_TABLET | Freq: Every day | ORAL | Status: DC
  Filled 2018-09-30: qty 1

## 2018-09-30 MED ORDER — OXYTOCIN 10 UNIT/ML IJ SOLN
INTRAMUSCULAR | Status: AC
  Filled 2018-09-30: qty 2

## 2018-09-30 MED ORDER — OXYTOCIN BOLUS FROM INFUSION
500.0000 mL | Freq: Once | INTRAVENOUS | Status: AC
  Administered 2018-09-30: 09:00:00 500 mL via INTRAVENOUS

## 2018-09-30 MED ORDER — LIDOCAINE HCL (PF) 1 % IJ SOLN: 30.0000 mL | INTRAMUSCULAR | Status: DC | PRN

## 2018-09-30 MED ORDER — LACTATED RINGERS IV SOLN: 500.0000 mL | INTRAVENOUS | Status: DC | PRN

## 2018-09-30 MED ORDER — DIPHENHYDRAMINE HCL 50 MG/ML IJ SOLN: 12.5000 mg | INTRAMUSCULAR | Status: DC | PRN

## 2018-09-30 MED ORDER — IBUPROFEN 600 MG PO TABS
600.0000 mg | ORAL_TABLET | Freq: Four times a day (QID) | ORAL | Status: DC
  Filled 2018-09-30: qty 1

## 2018-09-30 MED ORDER — WITCH HAZEL-GLYCERIN EX PADS: 1.0000 "application " | MEDICATED_PAD | CUTANEOUS | Status: DC | PRN

## 2018-09-30 MED ORDER — PHENYLEPHRINE 40 MCG/ML (10ML) SYRINGE FOR IV PUSH (FOR BLOOD PRESSURE SUPPORT): 80.0000 ug | PREFILLED_SYRINGE | INTRAVENOUS | Status: DC | PRN

## 2018-09-30 MED ORDER — SIMETHICONE 80 MG PO CHEW: 80.0000 mg | CHEWABLE_TABLET | ORAL | Status: DC | PRN

## 2018-09-30 MED ORDER — SENNOSIDES-DOCUSATE SODIUM 8.6-50 MG PO TABS
2.0000 | ORAL_TABLET | ORAL | Status: DC
  Administered 2018-09-30: 2 via ORAL
  Filled 2018-09-30: qty 2

## 2018-09-30 MED ORDER — COCONUT OIL OIL
1.0000 "application " | TOPICAL_OIL | Status: DC | PRN
  Administered 2018-10-01: 1 via TOPICAL
  Filled 2018-09-30: qty 120

## 2018-09-30 MED ORDER — DIBUCAINE (PERIANAL) 1 % EX OINT: 1.0000 "application " | TOPICAL_OINTMENT | CUTANEOUS | Status: DC | PRN

## 2018-09-30 MED ORDER — EPHEDRINE 5 MG/ML INJ: 10.0000 mg | INTRAVENOUS | Status: DC | PRN

## 2018-09-30 MED ORDER — DIPHENHYDRAMINE HCL 25 MG PO CAPS: 25.0000 mg | ORAL_CAPSULE | Freq: Four times a day (QID) | ORAL | Status: DC | PRN

## 2018-09-30 MED ORDER — BENZOCAINE-MENTHOL 20-0.5 % EX AERO: 1.0000 "application " | INHALATION_SPRAY | CUTANEOUS | Status: DC | PRN

## 2018-09-30 MED ORDER — LIDOCAINE HCL (PF) 1 % IJ SOLN
INTRAMUSCULAR | Status: AC
  Filled 2018-09-30: qty 30

## 2018-09-30 NOTE — Discharge Summary (Signed)
  OB Discharge Summary     Patient Name: Alyssa [name] DOB: 10/29/1980 MRN: [id number]  Date of admission: 09/30/2018 Delivering provider: Rod Can, CNM  Date of Delivery: 09/30/2018  Date of discharge: 10/01/2018  Admitting diagnosis: Pregnancy - contractions Intrauterine pregnancy: [redacted]w[redacted]d     Secondary diagnosis: None     Discharge diagnosis: Term Pregnancy Delivered                                                                                                Post partum procedures: None  Augmentation: none  Complications: None  Hospital course:  Onset of Labor With Vaginal Delivery      38 y.o. yo [id number] at [redacted]w[redacted]d was admitted in Active Labor on 09/30/2018.  Patient had an uncomplicated labor course as follows:  Membrane Rupture Time/Date: 8:19 AM ,09/30/2018   Patient had delivery of viable female 8:36 AM, 09/30/2018 See delivery note for details of delivery  Patient had an uncomplicated postpartum course.  Patient is discharged home in stable condition on 10/01/18.   Physical exam  Vitals:   09/30/18 1503 09/30/18 2020 09/30/18 2351 10/01/18 0843  BP: 97/64 114/77 104/67 121/79  Pulse: 77  76 66  Resp: 18 18 18 20   Temp: 97.9 F (36.6 C) 97.9 F (36.6 C) 98.1 F (36.7 C) 97.7 F (36.5 C)  TempSrc: Oral Oral Oral Oral  SpO2: 99%  99% 99%  Weight:      Height:       General: alert, cooperative and no distress Lochia: appropriate Uterine Fundus: firm Incision: N/A DVT Evaluation: No evidence of DVT seen on physical exam.  Labs: Lab Results  Component Value Date   WBC 10.8 (H) 10/01/2018   HGB 9.7 (L) 10/01/2018   HCT 29.5 (L) 10/01/2018   MCV 88.1 10/01/2018   PLT 192 10/01/2018    Discharge instruction: per After Visit Summary.  Medications:  Allergies as of 10/01/2018   No Known Allergies     Medication List    TAKE these medications   Prenatal Gummies/DHA & FA 0.4-32.5 MG Chew Chew by mouth.       Diet: routine diet  Activity:  Advance as tolerated. Pelvic rest for 6 weeks.   Outpatient follow up: Follow-up Information    Rod Can, CNM. Schedule an appointment as soon as possible for a visit in 6 week(s).   Specialty: Obstetrics Why: postpartum follow up visit Contact information: [address] [place] [address] [phone]             Postpartum contraception: Vasectomy Rhogam Given postpartum: NA Rubella vaccine given postpartum: Rubella Immune Varicella vaccine given postpartum: Varicella Immune TDaP given antepartum or postpartum: given antepartum  Newborn Data: Live born female [name] Birth Weight: 3780 g, 8 pounds 5 ounces  APGAR: 7, 9  Newborn Delivery   Birth date/time: 09/30/2018 08:36:00 Delivery type: Vaginal, Spontaneous       Baby Feeding: Breast  Disposition:home with mother  SIGNED:  Rexene Agent, CNM 10/01/2018 9:06 AM

## 2018-09-30 NOTE — Plan of Care (Signed)
Pt presents to labor and delivery with contractions. Pt admitted and progressed in labor. Pain managed well with epidural and pt pushed and delivered baby girl. Pt recovering well.

## 2018-09-30 NOTE — Anesthesia Procedure Notes (Signed)
Epidural Patient location during procedure: OB Start time: 09/30/2018 5:27 AM End time: 09/30/2018 5:32 AM  Staffing Anesthesiologist: Gorje Iyer, Precious Haws, MD Performed: anesthesiologist   Preanesthetic Checklist Completed: patient identified, site marked, surgical consent, pre-op evaluation, timeout performed, IV checked, risks and benefits discussed and monitors and equipment checked  Epidural Patient position: sitting Prep: ChloraPrep Patient monitoring: heart rate, continuous pulse ox and blood pressure Approach: midline Location: L3-L4 Injection technique: LOR saline  Needle:  Needle type: Tuohy  Needle gauge: 17 G Needle length: 9 cm and 9 Needle insertion depth: 5 cm Catheter type: closed end flexible Catheter size: 19 Gauge Catheter at skin depth: 10 cm Test dose: negative and 1.5% lidocaine with Epi 1:200 K  Assessment Sensory level: T10 Events: blood not aspirated, injection not painful, no injection resistance, negative IV test and no paresthesia  Additional Notes 1 attempt Pt. Evaluated and documentation done after procedure finished. Patient identified. Risks/Benefits/Options discussed with patient including but not limited to bleeding, infection, nerve damage, paralysis, failed block, incomplete pain control, headache, blood pressure changes, nausea, vomiting, reactions to medication both or allergic, itching and postpartum back pain. Confirmed with bedside nurse the patient's most recent platelet count. Confirmed with patient that they are not currently taking any anticoagulation, have any bleeding history or any family history of bleeding disorders. Patient expressed understanding and wished to proceed. All questions were answered. Sterile technique was used throughout the entire procedure. Please see nursing notes for vital signs. Test dose was given through epidural catheter and negative prior to continuing to dose epidural or start infusion. Warning signs of  high block given to the patient including shortness of breath, tingling/numbness in hands, complete motor block, or any concerning symptoms with instructions to call for help. Patient was given instructions on fall risk and not to get out of bed. All questions and concerns addressed with instructions to call with any issues or inadequate analgesia.   Patient tolerated the insertion well without immediate complications.Reason for block:procedure for pain

## 2018-09-30 NOTE — H&P (Signed)
Obstetrics Admission History & Physical   CC: painful ctxs  HPI:  38 y.o. [id number] @ [redacted]w[redacted]d (09/22/2018, by Ultrasound). Admitted on 09/30/2018:   Patient Active Problem List   Diagnosis Date Noted  . Normal labor and delivery 09/30/2018  . Advanced maternal age in multigravida, unspecified trimester 02/07/2018  . Supervision of other high risk pregnancies, unspecified trimester 02/07/2018     Presents for painful ctxs tonight, worsening over the last 2-3 hours; no VB or ROM.  Full term, desiring natural labor with minimal intervention except as necessary.  Prior NSVD x2.   Prenatal care at: at [facility]. Pregnancy complicated by AMA.  ROS: A review of systems was performed and negative, except as stated in the above HPI. PMHx:  Past Medical History:  Diagnosis Date  . Tobacco use in pregnancy, antepartum 02/07/2018   Cut back from 1/2 ppd to 2 cigarettes- would like to totally quit. Update- has quit 08/2018   PSHx: History reviewed. No pertinent surgical history. Medications:  Medications Prior to Admission  Medication Sig Dispense Refill Last Dose  . Prenatal MV-Min-FA-Omega-3 (PRENATAL GUMMIES/DHA & FA) 0.4-32.5 MG CHEW Chew by mouth.   09/29/2018 at Unknown time   Allergies: has No Known Allergies. OBHx:  OB History  Gravida Para Term Preterm AB Living  4 2 2   1 2   SAB TAB Ectopic Multiple Live Births  1       2    # Outcome Date GA Lbr Len/2nd Weight Sex Delivery Anes PTL Lv  4 Current           3 Term 08/20/14    F Vag-Spont   LIV     Birth Comments: was born with an "air pocket on her lung" was on oxygen for 72 hours, is now okay  2 Term 09/18/12    M Vag-Spont   LIV  1 SAB              Birth Comments: was her 1st pregnancy, she is unsure of the year- complete abortion at home   GMW:NUUVOZDG/UYQIHKVQQVZD except as detailed in HPI.Marland Kitchen  No family history of birth defects. Soc Hx: Never smoker, Alcohol: none, Recreational drug use: none and Pregnancy welcomed  Objective:    Vitals:   09/30/18 0154  BP: 120/77  Pulse: 93  Resp: 16  Temp: 97.8 F (36.6 C)   Constitutional: Well nourished, well developed female in no acute distress.  HEENT: normal Skin: Warm and dry.  Cardiovascular:Regular rate and rhythm.   Extremity: trace to 1+ bilateral pedal edema Respiratory: Clear to auscultation bilateral. Normal respiratory effort Abdomen: gravid, ND, FHT present, mild tenderness on exam Back: no CVAT Neuro: DTRs 2+, Cranial nerves grossly intact Psych: Alert and Oriented x3. No memory deficits. Normal mood and affect.  MS: normal gait, normal bilateral lower extremity ROM/strength/stability.  Pelvic exam: is not limited by body habitus EGBUS: within normal limits Vagina: within normal limits and with normal mucosa Cervix: CERVIX: 7 cm dilated, 80 effaced, -1 station, presenting part Vtx Uterus: Spontaneous uterine activity  Adnexa: not evaluated  EFM:FHR: 130 bpm, variability: moderate,  accelerations:  Present,  decelerations:  Absent Toco: Frequency: Every 3-5 minutes   Perinatal info:  Blood type: O positive Rubella- Immune Varicella -Immune TDaP Given during third trimester of this pregnancy RPR NR / HIV Neg/ HBsAg Neg   Assessment & Plan:   38 y.o. G3O7564 @ [redacted]w[redacted]d, Admitted on 09/30/2018:Active labor    Admit for labor, Observe for cervical change,  Fetal Wellbeing Reassuring and AROM when Appropriate GBS Neg Plans to breast feed BC- BTL vs Vasectomy  Annamarie MajorPaul Calieb Lichtman, MD, Merlinda FrederickFACOG [facility], [facility] 09/30/2018  4:34 AM

## 2018-09-30 NOTE — OB Triage Note (Signed)
Recvd pt from ED. Pt c/o contractions about every 10 min that started yesterday morning. Pt denies LOF or vaginal bleeding. Pt states she lost her mucous plug twice today. Feeling baby move well.

## 2018-09-30 NOTE — Lactation Note (Signed)
This note was copied from a baby's chart. Lactation Consultation Note  Patient Name: Alyssa [name] [id number] Date: 09/30/2018 Reason for consult: Initial assessment;Mother's request;Term  Mom was struggling to achieve latch initially and asked for lactation consultant to assist.  Mom hand expressed a couple of drops.  [name] latched with minimal assistance and began strong, rhythmic sucking with occasional swallows.  Mom breast fed other 2 for 9 months.  With first, he had latch issues in beginning and she had to use nipple shield for a while.  Reviewed feeding cues, newborn stomach size, supply and demand, normal course of lactation and routine newborn feeding patterns.  Encouraged mom to ask for lactation assistance or if had questions or concerns.   Maternal Data Formula Feeding for Exclusion: No Has patient been taught Hand Expression?: Yes Does the patient have breastfeeding experience prior to this delivery?: Yes  Feeding Feeding Type: Breast Fed  LATCH Score Latch: Repeated attempts needed to sustain latch, nipple held in mouth throughout feeding, stimulation needed to elicit sucking reflex.  Audible Swallowing: A few with stimulation  Type of Nipple: Everted at rest and after stimulation  Comfort (Breast/Nipple): Soft / non-tender  Hold (Positioning): Assistance needed to correctly position infant at breast and maintain latch.  LATCH Score: 7  Interventions Interventions: Breast feeding basics reviewed;Assisted with latch;Skin to skin;Breast massage;Hand express;Reverse pressure;Breast compression;Adjust position;Support pillows;Position options  Lactation Tools Discussed/Used WIC Program: No([employer])   Consult Status Consult Status: Follow-up Date: 09/30/18 Follow-up type: Call as needed    [name] 09/30/2018, 9:51 AM

## 2018-09-30 NOTE — Discharge Instructions (Signed)
Call your doctor for increased pain or vaginal bleeding, temperature above 100.4, depression, or concerns.  Continue prenatal vitamin and iron.  No strenuous activity or heavy lifting for 6 weeks.  No intercourse, tampons, or douching for 6 weeks.  No tub baths- showers only.  No driving for 2 weeks or while taking pain medications.  Please follow instructions included in incision hygiene kit.  Call your doctor for incision concerns including redness, swelling, bleeding or drainage, or if begins to come apart.

## 2018-09-30 NOTE — ED Notes (Signed)
Obs 4  harris

## 2018-09-30 NOTE — Anesthesia Preprocedure Evaluation (Signed)
Anesthesia Evaluation  Patient identified by MRN, date of birth, ID band Patient awake    Reviewed: Allergy & Precautions, H&P , NPO status , Patient's Chart, lab work & pertinent test results  History of Anesthesia Complications Negative for: history of anesthetic complications  Airway Mallampati: II  TM Distance: >3 FB Neck ROM: full    Dental  (+) Chipped   Pulmonary neg pulmonary ROS, Current Smoker and Patient abstained from smoking.,           Cardiovascular Exercise Tolerance: Good (-) hypertensionnegative cardio ROS       Neuro/Psych    GI/Hepatic negative GI ROS,   Endo/Other    Renal/GU   negative genitourinary   Musculoskeletal   Abdominal   Peds  Hematology negative hematology ROS (+)   Anesthesia Other Findings Past Medical History: 02/07/2018: Tobacco use in pregnancy, antepartum     Comment:  Cut back from 1/2 ppd to 2 cigarettes- would like to               totally quit. Update- has quit 08/2018  History reviewed. No pertinent surgical history.  BMI    Body Mass Index: 24.97 kg/m      Reproductive/Obstetrics (+) Pregnancy                             Anesthesia Physical Anesthesia Plan  ASA: II  Anesthesia Plan: Epidural   Post-op Pain Management:    Induction:   PONV Risk Score and Plan:   Airway Management Planned:   Additional Equipment:   Intra-op Plan:   Post-operative Plan:   Informed Consent: I have reviewed the patients History and Physical, chart, labs and discussed the procedure including the risks, benefits and alternatives for the proposed anesthesia with the patient or authorized representative who has indicated his/her understanding and acceptance.       Plan Discussed with: Anesthesiologist  Anesthesia Plan Comments:         Anesthesia Quick Evaluation

## 2018-10-01 LAB — CBC
HCT: 29.5 % — ABNORMAL LOW (ref 36.0–46.0)
Hemoglobin: 9.7 g/dL — ABNORMAL LOW (ref 12.0–15.0)
MCH: 29 pg (ref 26.0–34.0)
MCHC: 32.9 g/dL (ref 30.0–36.0)
MCV: 88.1 fL (ref 80.0–100.0)
Platelets: 192 10*3/uL (ref 150–400)
RBC: 3.35 MIL/uL — ABNORMAL LOW (ref 3.87–5.11)
RDW: 13.4 % (ref 11.5–15.5)
WBC: 10.8 10*3/uL — ABNORMAL HIGH (ref 4.0–10.5)
nRBC: 0 % (ref 0.0–0.2)

## 2018-10-01 LAB — RPR: RPR Ser Ql: NONREACTIVE

## 2018-10-01 NOTE — Lactation Note (Signed)
This note was copied from a baby's chart. Lactation Consultation Note  Patient Name: Alyssa [name] MEBRA'X Date: 10/01/2018 Reason for consult: Follow-up assessment   Maternal Data    Feeding Feeding Type: Breast Fed  LATCH Score                   Interventions    Lactation Tools Discussed/Used     Consult Status  LC spoke with family to see if they had any breastfeeding questions or concerns. Parents denied any so LC reviewed basics: hand expression, skin to skin, I/O, and breastfeeding support.    Marnee Spring 10/01/2018, 11:55 AM

## 2018-10-01 NOTE — Progress Notes (Signed)
Discharge instructions provided.  Pt and sig other verbalize understanding of all instructions and follow-up care.  Pt discharged to home with infant at 1220 on 10/01/18 via wheelchair by RN. Reed Breech, RN 10/01/2018 12:32 PM

## 2018-10-04 NOTE — Anesthesia Postprocedure Evaluation (Signed)
Anesthesia Post Note  Patient: Alyssa [name]  Procedure(s) Performed: AN AD HOC LABOR EPIDURAL  Anesthesia Type: Epidural Comments: Patient discharged before she could be seen by the epidural team     Last Vitals: There were no vitals filed for this visit.  Last Pain: There were no vitals filed for this visit.               Precious Haws Shaela Boer

## 2018-11-24 ENCOUNTER — Other Ambulatory Visit: Payer: Self-pay

## 2018-11-24 ENCOUNTER — Ambulatory Visit (INDEPENDENT_AMBULATORY_CARE_PROVIDER_SITE_OTHER): Payer: Managed Care, Other (non HMO) | Admitting: Advanced Practice Midwife

## 2018-11-24 ENCOUNTER — Encounter: Payer: Self-pay | Admitting: Advanced Practice Midwife

## 2018-11-24 DIAGNOSIS — Z1389 Encounter for screening for other disorder: Secondary | ICD-10-CM | POA: Diagnosis not present

## 2018-11-24 DIAGNOSIS — Z23 Encounter for immunization: Secondary | ICD-10-CM | POA: Diagnosis not present

## 2018-11-24 NOTE — Progress Notes (Signed)
Postpartum Visit  Chief Complaint:  Chief Complaint  Patient presents with  . Postpartum Care    vaginal delivery 8/28    History of Present Illness: Patient is a 38 y.o. [name] presents for postpartum visit.   Review the Delivery Report for details.  Date of delivery: 09/30/2018 Type of delivery: Vaginal delivery - Vacuum or forceps assisted  no Episiotomy No.  Laceration: right labial abrasion  Pregnancy or labor problems:  no Any problems since the delivery:  no  Newborn Details:  SINGLETON :  1. BabyGender female. Birth weight: 8 pounds 5 ounces Maternal Details:  Breast or formula feeding: breastfeeding Intercourse: No  Contraception after delivery: condoms with eventual vasectomy Any bowel or bladder issues: No  Post partum depression/anxiety noted:  no Edinburgh Post-Partum Depression Score: 0 Date of last PAP: 03/10/2016  no abnormalities   Review of Systems: Review of Systems  Constitutional: Negative.   HENT: Negative.   Eyes: Negative.   Respiratory: Negative.   Cardiovascular: Negative.   Gastrointestinal: Negative.   Genitourinary: Negative.   Musculoskeletal: Negative.   Skin: Negative.   Neurological: Negative.   Endo/Heme/Allergies: Negative.   Psychiatric/Behavioral: Negative.      Past Medical History:  Past Medical History:  Diagnosis Date  . Tobacco use in pregnancy, antepartum 02/07/2018   Cut back from 1/2 ppd to 2 cigarettes- would like to totally quit. Update- has quit 08/2018    Past Surgical History:  History reviewed. No pertinent surgical history.  Family History:  Family History  Problem Relation Age of Onset  . Hypertension Mother   . Hypertension Brother   . Breast cancer Maternal Grandmother   . Colon cancer Maternal Grandfather   . Breast cancer Paternal Grandmother     Social History:  Social History   Socioeconomic History  . Marital status: Single    Spouse name: Not on file  . Number of children: Not on  file  . Years of education: Not on file  . Highest education level: Not on file  Occupational History  . Not on file  Social Needs  . Financial resource strain: Not on file  . Food insecurity    Worry: Not on file    Inability: Not on file  . Transportation needs    Medical: Not on file    Non-medical: Not on file  Tobacco Use  . Smoking status: Current Every Day Smoker  . Smokeless tobacco: Never Used  Substance and Sexual Activity  . Alcohol use: Yes  . Drug use: No  . Sexual activity: Yes    Birth control/protection: None  Lifestyle  . Physical activity    Days per week: Not on file    Minutes per session: Not on file  . Stress: Not on file  Relationships  . Social Musician on phone: Not on file    Gets together: Not on file    Attends religious service: Not on file    Active member of club or organization: Not on file    Attends meetings of clubs or organizations: Not on file    Relationship status: Not on file  . Intimate partner violence    Fear of current or ex partner: Not on file    Emotionally abused: Not on file    Physically abused: Not on file    Forced sexual activity: Not on file  Other Topics Concern  . Not on file  Social History Narrative  Engaged.   2 children.   Works as a Furniture conservator/restorer.   Enjoys spending time with children.      Allergies:  No Known Allergies  Medications: Prior to Admission medications   Medication Sig Start Date End Date Taking? Authorizing Provider  Prenatal MV-Min-FA-Omega-3 (PRENATAL GUMMIES/DHA & FA) 0.4-32.5 MG CHEW Chew by mouth.    [provider]    Physical Exam Blood pressure 110/78, pulse Alyssa, weight 150 lb (68 kg), currently breastfeeding.    General: NAD HEENT: normocephalic, anicteric Pulmonary: No increased work of breathing Abdomen: NABS, soft, non-tender, non-distended.  Umbilicus without lesions.  No hepatomegaly, splenomegaly or masses palpable. No evidence of  hernia. Genitourinary:  External: Normal external female genitalia.  Normal urethral meatus, normal Bartholin's and Skene's glands.    Vagina: Normal vaginal mucosa, no evidence of prolapse.    Cervix: Grossly normal in appearance, no bleeding, no CMT  Uterus: Non-enlarged, mobile, normal contour.    Adnexa: ovaries non-enlarged, no adnexal masses  Rectal: deferred Extremities: no edema, erythema, or tenderness Neurologic: Grossly intact Psychiatric: mood appropriate, affect full  Edinburgh Postnatal Depression Scale - 11/24/18 0917      Edinburgh Postnatal Depression Scale:  In the Past 7 Days   I have been able to laugh and see the funny side of things.  0    I have looked forward with enjoyment to things.  0    I have blamed myself unnecessarily when things went wrong.  0    I have been anxious or worried for no good reason.  0    I have felt scared or panicky for no good reason.  0    Things have been getting on top of me.  0    I have been so unhappy that I have had difficulty sleeping.  0    I have felt sad or miserable.  0    I have been so unhappy that I have been crying.  0    The thought of harming myself has occurred to me.  0    Edinburgh Postnatal Depression Scale Total  0       Assessment: 38 y.o. A1O8786 presenting for 6 week postpartum visit  Plan: Problem List Items Addressed This Visit    None    Visit Diagnoses    Flu vaccine need    -  Primary   Relevant Orders   Flu Vaccine QUAD 36+ mos IM (Completed)       1) Contraception - Education given regarding options for contraception, as well as compatibility with breast feeding if applicable.  Patient plans on vasectomy for contraception. Couple will use condoms until vasectomy  2)  Pap - ASCCP guidelines and rational discussed.  ASCCP guidelines and rational discussed.  Patient opts for every 3 years screening interval  3) Patient underwent screening for postpartum depression with no signs of  depression  4) Return in about 1 year (around 11/24/2019) for annual established gyn.   Rod Can, [facility] 11/24/2018, 9:29 AM

## 2019-04-10 IMAGING — US US BREAST*L* LIMITED INC AXILLA
1 series · 2 of 2 positions shown · non-contrast
Comparison: None.

CLINICAL DATA: LEFT breast tenderness spine nipple for 2 weeks. No
palpable lump. This is patient's baseline mammogram.

EXAM:
DIGITAL DIAGNOSTIC BILATERAL MAMMOGRAM WITH CAD AND TOMO
ULTRASOUND LEFT BREAST

[Series 1: us breast*left* limited inc axilla · 0.04mm/px · 2 of 2 slices shown]
[im 1/2]
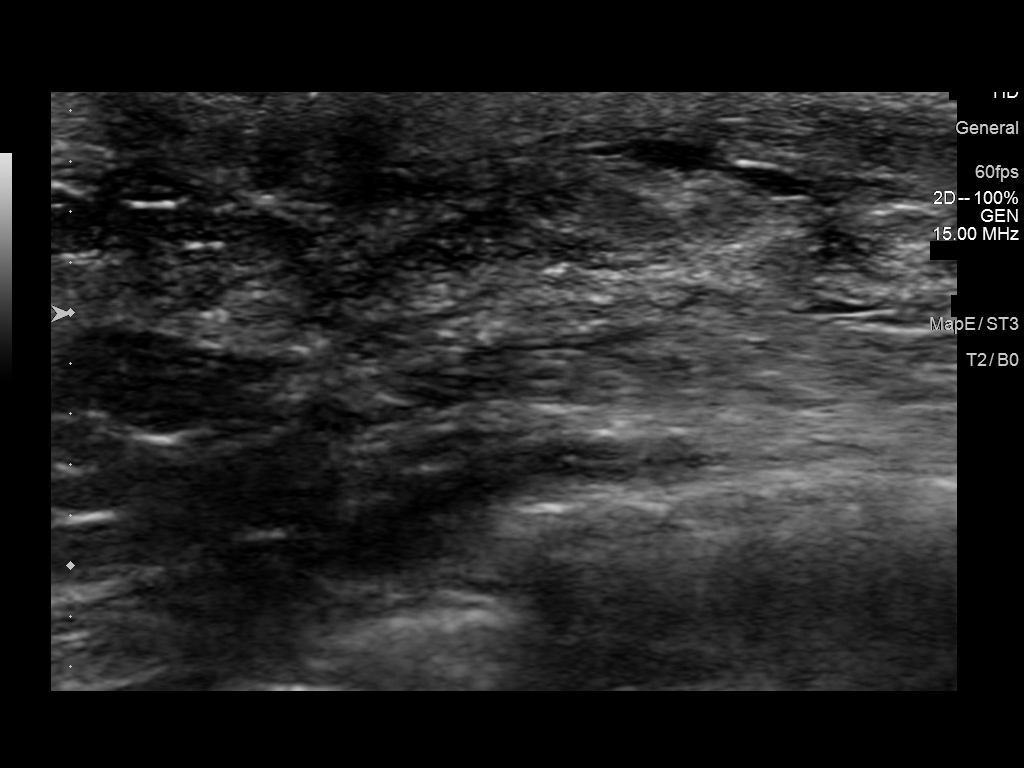
[im 2/2]
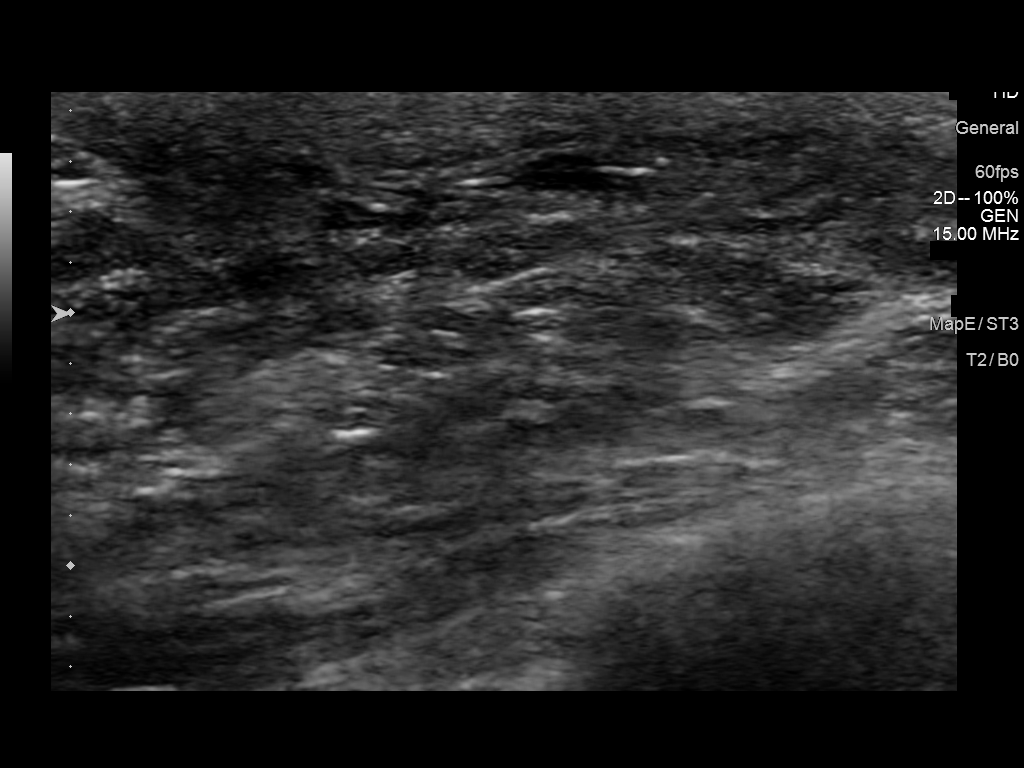

[2 of 2 positions shown; findings below may reference images not displayed]

ACR Breast Density Category d: The breast tissue is extremely dense,
which lowers the sensitivity of mammography.
FINDINGS: There are no dominant masses, suspicious calcifications or secondary
signs of malignancy within either breast. Specifically, there is no
mammographic abnormality within the subareolar LEFT breast
corresponding to the area of clinical concern.

Mammographic images were processed with CAD.

Targeted ultrasound is performed, evaluating the subareolar LEFT
breast and periareolar LEFT breast ([DATE] to [DATE] axes), showing only
normal fibroglandular tissues and fat lobules throughout. No solid
or cystic mass. No significant ductal dilatation.
IMPRESSION: No evidence of malignancy within either breast. Specifically, no
mammographic or sonographic evidence of malignancy within the
subareolar or periareolar LEFT breast corresponding to the area of
clinical concern.

RECOMMENDATION:
1. Screening mammogram at age 40 unless there are persistent or
intervening clinical concerns. (Code:[id number])
2. Benign causes of breast pain, and possible remedies, were
discussed with the patient. Patient was encouraged to follow-up with
referring physician if a palpable lump/mass developed.

I have discussed the findings and recommendations with the patient.
Results were also provided in writing at the conclusion of the
visit. If applicable, a reminder letter will be sent to the patient
regarding the next appointment.

BI-RADS CATEGORY  1: Negative.

## 2019-08-17 ENCOUNTER — Ambulatory Visit
Admission: EM | Admit: 2019-08-17 | Discharge: 2019-08-17 | Disposition: A | Discharge status: Home / Self Care | Payer: Managed Care, Other (non HMO) | Attending: Family Medicine | Admitting: Family Medicine

## 2019-08-17 DIAGNOSIS — R000 Tachycardia, unspecified: Secondary | ICD-10-CM | POA: Insufficient documentation

## 2019-08-17 DIAGNOSIS — R21 Rash and other nonspecific skin eruption: Secondary | ICD-10-CM | POA: Diagnosis present

## 2019-08-17 DIAGNOSIS — R233 Spontaneous ecchymoses: Secondary | ICD-10-CM | POA: Diagnosis not present

## 2019-08-17 LAB — POCT URINE PREGNANCY: Preg Test, Ur: NEGATIVE

## 2019-08-17 LAB — POCT URINALYSIS DIP (MANUAL ENTRY)
Glucose, UA: 100 mg/dL — AB
Nitrite, UA: NEGATIVE
Protein Ur, POC: 30 mg/dL — AB
Spec Grav, UA: 1.02 (ref 1.010–1.025)
Urobilinogen, UA: 2 E.U./dL — AB
pH, UA: 5.5 (ref 5.0–8.0)

## 2019-08-17 LAB — POCT FASTING CBG KUC MANUAL ENTRY: POCT Glucose (KUC): 106 mg/dL — AB (ref 70–99)

## 2019-08-17 NOTE — ED Provider Notes (Signed)
Renaldo Fiddler    CSN: [id number] Arrival date & time: 08/17/19  1055      History   Chief Complaint Chief Complaint  Patient presents with  . Rash    HPI Alyssa [name] is a 39 y.o. female.   Patient reports today with new onset of a rash that she describes as small red dots all the way down both legs and to abdomen and trunk.  Denies ever having a rash like this before.  Reports that she is an 1-month-old baby at home.  Reports that she is a current daily smoker.  Reports that she has a very stressful job lately, and that this has been affecting her emotionally.  Denies any medical history of blood disorders, thrombocytopenia.  Denies family history of blood disorders, blood cancers.  Denies headache, cough, shortness of breath, nausea, vomiting, diarrhea, fever, other symptoms.  ROS per HPI  The history is provided by the patient.  Rash   Past Medical History:  Diagnosis Date  . Tobacco use in pregnancy, antepartum 02/07/2018   Cut back from 1/2 ppd to 2 cigarettes- would like to totally quit. Update- has quit 08/2018    Patient Active Problem List   Diagnosis Date Noted  . Postpartum care following vaginal delivery 09/30/2018  . Advanced maternal age in multigravida, unspecified trimester 02/07/2018  . Supervision of other high risk pregnancies, unspecified trimester 02/07/2018    No past surgical history on file.  OB History    Gravida  4   Para  3   Term  3   Preterm      AB  1   Living  3     SAB  1   TAB      Ectopic      Multiple  0   Live Births  3            Home Medications    Prior to Admission medications   Medication Sig Start Date End Date Taking? Authorizing Provider  Prenatal MV-Min-FA-Omega-3 (PRENATAL GUMMIES/DHA & FA) 0.4-32.5 MG CHEW Chew by mouth.    [provider]    Family History Family History  Problem Relation Age of Onset  . Hypertension Mother   . Hypertension Brother   . Breast cancer  Maternal Grandmother   . Colon cancer Maternal Grandfather   . Breast cancer Paternal Grandmother     Social History Social History   Tobacco Use  . Smoking status: Current Every Day Smoker  . Smokeless tobacco: Never Used  Vaping Use  . Vaping Use: Never used  Substance Use Topics  . Alcohol use: Not Currently  . Drug use: No     Allergies   Patient has no known allergies.   Review of Systems Review of Systems  Skin: Positive for rash.     Physical Exam Triage Vital Signs ED Triage Vitals  Enc Vitals Group     BP 08/17/19 1100 117/84     Pulse Rate 08/17/19 1100 (!) 107     Resp 08/17/19 1100 12     Temp 08/17/19 1100 99 F (37.2 C)     Temp src --      SpO2 08/17/19 1100 95 %     Weight --      Height --      Head Circumference --      Peak Flow --      Pain Score 08/17/19 1056 0     Pain Loc --  Pain Edu? --      Excl. in GC? --    No data found.  Updated Vital Signs BP 117/84   Pulse (!) 107   Temp 99 F (37.2 C)   Resp 12   LMP 08/06/2019 (Exact Date)   SpO2 95%   Breastfeeding No   Visual Acuity Right Eye Distance:   Left Eye Distance:   Bilateral Distance:    Right Eye Near:   Left Eye Near:    Bilateral Near:     Physical Exam Vitals and nursing note reviewed.  Constitutional:      General: She is not in acute distress.    Appearance: Normal appearance. She is well-developed and normal weight. She is not ill-appearing.  HENT:     Head: Normocephalic and atraumatic.  Eyes:     Extraocular Movements: Extraocular movements intact.     Conjunctiva/sclera: Conjunctivae normal.     Pupils: Pupils are equal, round, and reactive to light.  Cardiovascular:     Rate and Rhythm: Normal rate and regular rhythm.     Heart sounds: Normal heart sounds. No murmur heard.   Pulmonary:     Effort: Pulmonary effort is normal. No respiratory distress.     Breath sounds: Normal breath sounds. No stridor. No wheezing, rhonchi or rales.    Chest:     Chest wall: No tenderness.  Abdominal:     General: Bowel sounds are normal. There is no distension.     Palpations: Abdomen is soft. There is no mass.     Tenderness: There is no abdominal tenderness. There is no right CVA tenderness, left CVA tenderness, guarding or rebound.     Hernia: No hernia is present.  Musculoskeletal:        General: Normal range of motion.     Cervical back: Normal range of motion and neck supple.  Skin:    General: Skin is warm and dry.     Capillary Refill: Capillary refill takes less than 2 seconds.     Findings: Petechiae present.     Comments: Petechiae to tops of feet, bilateral lower extremities, 2 abdomen and back.  Neurological:     General: No focal deficit present.     Mental Status: She is alert and oriented to person, place, and time.  Psychiatric:        Mood and Affect: Mood normal.        Behavior: Behavior normal.        Thought Content: Thought content normal.      UC Treatments / Results  Labs (all labs ordered are listed, but only abnormal results are displayed) Labs Reviewed  POCT URINALYSIS DIP (MANUAL ENTRY) - Abnormal; Notable for the following components:      Result Value   Clarity, UA hazy (*)    Glucose, UA =100 (*)    Bilirubin, UA small (*)    Ketones, POC UA small (15) (*)    Blood, UA small (*)    Protein Ur, POC =30 (*)    Urobilinogen, UA 2.0 (*)    Leukocytes, UA Trace (*)    All other components within normal limits  POCT FASTING CBG [facility] MANUAL ENTRY - Abnormal; Notable for the following components:   POCT Glucose ([facility]) 106 (*)    All other components within normal limits  CBC WITH DIFFERENTIAL/PLATELET  COMPREHENSIVE METABOLIC PANEL  POCT URINE PREGNANCY    EKG   Radiology No results found.  Procedures Procedures (including  critical care time)  Medications Ordered in UC Medications - No data to display  Initial Impression / Assessment and Plan / UC Course  I have reviewed the  triage vital signs and the nursing notes.  Pertinent labs & imaging results that were available during my care of the patient were reviewed by me and considered in my medical decision making (see chart for details).     Rash Petechiae Tachycardia  Presents today with rash that has been present for the last 2 days Rash is consistent with petechiae and covers bilateral lower extremities and trunk Denies any pain Denies any blood disorders or history of blood cancers Patient is a current daily smoker Denies ever having a rash like this before Urine pregnancy negative today UA positive for ketones, protein, bilirubin, blood, urobilinogen Will culture Checking CBC and CMP CBG 106 in office today We will be in contact with patient tomorrow about any abnormal results Instructed patient that if she notices more bruising, the rash is spreading, petechia reaches her face, anything changes significantly, she is to follow-up with the ER Verbalizes understanding is in agreement treatment plan Final Clinical Impressions(s) / UC Diagnoses   Final diagnoses:  Petechiae  Rash and nonspecific skin eruption  Tachycardia     Discharge Instructions     We are going to check some blood to see if we can help figure out where the rash is coming from  We will be in contact with you about any abnormal results  All results will be available via MyChart  If you notice new bruising, fatigue, trouble swallowing, trouble breathing, other concerning symptoms, follow-up with the ER    ED Prescriptions    None     PDMP not reviewed this encounter.   Moshe Cipro, NP 08/17/19 1155

## 2019-08-17 NOTE — Discharge Instructions (Addendum)
We are going to check some blood to see if we can help figure out where the rash is coming from  We will be in contact with you about any abnormal results  All results will be available via MyChart  If you notice new bruising, fatigue, trouble swallowing, trouble breathing, other concerning symptoms, follow-up with the ER

## 2019-08-17 NOTE — ED Triage Notes (Signed)
Patient reports two rashes. One is a heat rash on her chest she noticed a few days ago. Yesterday, she noticed a red spotty rash on her bilateral lower extremities. Reports today it has spread up to her trunk.   Denies: itching, pain. Denies switching laundry soap, shampoo, or body wash  OTC: none

## 2019-08-18 ENCOUNTER — Telehealth: Payer: Self-pay | Admitting: Family Medicine

## 2019-08-18 DIAGNOSIS — R233 Spontaneous ecchymoses: Secondary | ICD-10-CM | POA: Insufficient documentation

## 2019-08-18 DIAGNOSIS — D696 Thrombocytopenia, unspecified: Secondary | ICD-10-CM | POA: Insufficient documentation

## 2019-08-18 LAB — CBC WITH DIFFERENTIAL/PLATELET
Basophils Absolute: 0.1 10*3/uL (ref 0.0–0.2)
Basos: 2 %
EOS (ABSOLUTE): 0.1 10*3/uL (ref 0.0–0.4)
Eos: 1 %
Hematocrit: 38.1 % (ref 34.0–46.6)
Hemoglobin: 12.7 g/dL (ref 11.1–15.9)
Immature Grans (Abs): 0 10*3/uL (ref 0.0–0.1)
Immature Granulocytes: 0 %
Lymphocytes Absolute: 3.1 10*3/uL (ref 0.7–3.1)
Lymphs: 50 %
MCH: 28.4 pg (ref 26.6–33.0)
MCHC: 33.3 g/dL (ref 31.5–35.7)
MCV: 85 fL (ref 79–97)
Monocytes Absolute: 0.5 10*3/uL (ref 0.1–0.9)
Monocytes: 7 %
Neutrophils Absolute: 2.5 10*3/uL (ref 1.4–7.0)
Neutrophils: 40 %
Platelets: 7 10*3/uL — CL (ref 150–450)
RBC: 4.47 x10E6/uL (ref 3.77–5.28)
RDW: 13.1 % (ref 11.7–15.4)
WBC: 6.2 10*3/uL (ref 3.4–10.8)

## 2019-08-18 LAB — URINE CULTURE: Culture: <10000 — AB

## 2019-08-18 LAB — COMPREHENSIVE METABOLIC PANEL
ALT: 58 IU/L — ABNORMAL HIGH (ref 0–32)
AST: 50 IU/L — ABNORMAL HIGH (ref 0–40)
Albumin/Globulin Ratio: 1.5 (ref 1.2–2.2)
Albumin: 4.4 g/dL (ref 3.8–4.8)
Alkaline Phosphatase: 112 IU/L (ref 48–121)
BUN/Creatinine Ratio: 8 — ABNORMAL LOW (ref 9–23)
BUN: 6 mg/dL (ref 6–20)
Bilirubin Total: 0.7 mg/dL (ref 0.0–1.2)
CO2: 22 mmol/L (ref 20–29)
Calcium: 8.8 mg/dL (ref 8.7–10.2)
Chloride: 98 mmol/L (ref 96–106)
Creatinine, Ser: 0.74 mg/dL (ref 0.57–1.00)
GFR calc Af Amer: 120 mL/min/{1.73_m2} (ref >59)
GFR calc non Af Amer: 104 mL/min/{1.73_m2} (ref >59)
Globulin, Total: 3 g/dL (ref 1.5–4.5)
Glucose: 100 mg/dL — ABNORMAL HIGH (ref 65–99)
Potassium: 3.6 mmol/L (ref 3.5–5.2)
Sodium: 135 mmol/L (ref 134–144)
Total Protein: 7.4 g/dL (ref 6.0–8.5)

## 2019-08-18 NOTE — Telephone Encounter (Signed)
Spoke with Alyssa [name] about her very low platelet level. Discussed that the best option for her right now would be to go to the ER for further testing and to receive platelets. She is in agreement and will go in her personal vehicle.

## 2019-08-29 DIAGNOSIS — D693 Immune thrombocytopenic purpura: Secondary | ICD-10-CM | POA: Insufficient documentation

## 2019-09-04 ENCOUNTER — Encounter: Payer: Self-pay | Admitting: Family Medicine

## 2019-09-04 ENCOUNTER — Other Ambulatory Visit: Payer: Self-pay

## 2019-09-04 ENCOUNTER — Ambulatory Visit: Payer: Managed Care, Other (non HMO) | Admitting: Family Medicine

## 2019-09-04 VITALS — BP 110/80 | HR 73 | Temp 98.3°F | Ht 69.0 in | Wt 142.5 lb

## 2019-09-04 DIAGNOSIS — D696 Thrombocytopenia, unspecified: Secondary | ICD-10-CM | POA: Diagnosis not present

## 2019-09-04 DIAGNOSIS — F419 Anxiety disorder, unspecified: Secondary | ICD-10-CM

## 2019-09-04 LAB — TSH: TSH: 1.83 u[IU]/mL (ref 0.35–4.50)

## 2019-09-04 LAB — VITAMIN D 25 HYDROXY (VIT D DEFICIENCY, FRACTURES): VITD: 30.64 ng/mL (ref 30.00–100.00)

## 2019-09-04 NOTE — Patient Instructions (Signed)
Good to see you today  Please schedule a follow up in 1 month, can be virtual, get in touch sooner if needed  How to help anxiety and depression   1) Regular Exercise - walking, jogging, cycling, dancing, strength training - aiming for 150 minutes of exercise a week -- Yoga has been shown in research to reduce depression and anxiety -- with even just one hour long session per week -- Walk leisurely for 30 minutes every day  2)  Begin a Mindfulness/Meditation practice -- this can take a [place] as 3 minutes and is helpful for all kinds of mood issues -- You can find resources in books -- Or you can download apps like  -- Headspace App  -- Calm  -- Insignt Timer -- Stop, Breathe & Think   # With each of these Apps - you should decline the "start free trial" offer and as you search through the App should be able to access some of their free content. You can also chose to pay for the content if you find one that works well for you.    # Many of them also offer sleep specific content which may help with insomnia   3) Healthy Diet - Avoid fast foods and processed foods, eat mostly lean proteins, vegetables, fruits and whole grains -- Avoid or decrease Caffeine -- Avoid or decrease Alcohol -- Drink plenty of water, have a balanced diet -- Avoid cigarettes and marijuana (as well as other recreational drugs)   4) Consider contacting a professional therapist  [facility] [phone]

## 2019-09-04 NOTE — Progress Notes (Signed)
Subjective:    Patient ID: Alyssa [name], female    DOB: 01/15/1981, 39 y.o.   MRN: [id number]  HPI Chief Complaint  Patient presents with  . Depression    x 1 month  . Anxiety    x 1 month    This is a 39 year old female accompanied by her 30-year-old daughter.  Has an almost 74 year old and 38 year old.  She comes in today to discuss anxiety and depression that started approximately 1 month ago.  Symptoms seem to come out of the blue with increased feelings of anxiety and emotional lability with frequent crying.  She was having problems at work and was worried about her company going under, losing her job and her coworkers losing their jobs.  She denies ever having problems with anxiety or depression in the past. Was very tearful but this has improved. Is feeling better overall.  She had a recent episode of petechiae and subsequent hospitalization for thrombocytopenia.  She is currently being followed by hematology at [facility].  She had very low platelets which have improved some.  She has upcoming follow-up.  At this point cause is unknown.  She is currently on prednisone 20 mg daily. She reports that her mood is generally improved now.  She is not interested in taking medication but is not sure why her anxiety and depression started in the first place.  She was concerned that maybe she had postpartum depression but her daughter is almost 55 year old.  She is sleeping well.  Appetite normal.  She has good family support from husband and her mother-in-law.  She is not overly anxious regarding thrombocytopenia and recent hospitalization.   Review of Systems Per HPI    Objective:   Physical Exam Vitals reviewed.  Constitutional:      General: She is not in acute distress.    Appearance: Normal appearance. She is normal weight. She is not ill-appearing, toxic-appearing or diaphoretic.  HENT:     Head: Normocephalic and atraumatic.  Eyes:     Conjunctiva/sclera: Conjunctivae normal.   Cardiovascular:     Rate and Rhythm: Normal rate.  Pulmonary:     Effort: Pulmonary effort is normal.  Skin:    General: Skin is warm and dry.     Findings: Bruising (bilateral antecubital areas. ) and rash (resolving petichiae on bilateral legs) present.  Neurological:     Mental Status: She is alert and oriented to person, place, and time.  Psychiatric:        Mood and Affect: Mood normal.        Behavior: Behavior normal.        Thought Content: Thought content normal.        Judgment: Judgment normal.       BP 110/80   Pulse 73   Temp 98.3 F (36.8 C) (Temporal)   Ht 5\' 9"  (1.753 m)   Wt 142 lb 8 oz (64.6 kg)   LMP 08/06/2019 (Exact Date)   SpO2 99%   BMI 21.04 kg/m  Wt Readings from Last 3 Encounters:  09/04/19 142 lb 8 oz (64.6 kg)  11/24/18 150 lb (68 kg)  09/30/18 179 lb (81.2 kg)     Patient reports that she answered depression screen and GAD-7 based on the worst of her symptoms over the last 2 weeks. Depression screen PHQ 2/9 09/04/2019  Decreased Interest 1  Down, Depressed, Hopeless 1  PHQ - 2 Score 2  Altered sleeping 2  Tired, decreased energy 1  Change in appetite 0  Feeling bad or failure about yourself  1  Trouble concentrating 0  Moving slowly or fidgety/restless 0  Suicidal thoughts 1  PHQ-9 Score 7  Difficult doing work/chores Somewhat difficult   GAD 7 : Generalized Anxiety Score 09/04/2019  Nervous, Anxious, on Edge 1  Control/stop worrying 2  Worry too much - different things 2  Trouble relaxing 0  Restless 0  Easily annoyed or irritable 1  Afraid - awful might happen 2  Total GAD 7 Score 8  Anxiety Difficulty Somewhat difficult     Assessment & Plan:  1. Anxiety -I reviewed her recent labs in care everywhere.  Does not appear that a TSH has been done recently.  Will check vitamin D as well. -Patient reports that her symptoms have significantly improved over the last several weeks.  I cannot help but wonder if there was some type  of an autoimmune or viral process going on that caused her thrombocytopenia as well as new onset anxiety and depression -The patient reports that she does not like taking medication and would like to follow-up in 4 weeks to see if symptoms are improving -Given her short onset of symptoms I think that it is reasonable to work on nonmedication interventions and I provided a list to her.  She was instructed to get in touch if symptoms worsen. - TSH - Vitamin D, 25-hydroxy  2. Thrombocytopenia (HCC) -Unknown cause.  Some improvement in platelets.  She has upcoming follow-up at [facility].  This visit occurred during the SARS-CoV-2 public health emergency.  Safety protocols were in place, including screening questions prior to the visit, additional usage of staff PPE, and extensive cleaning of exam room while observing appropriate contact time as indicated for disinfecting solutions.    Over 30 minutes were spent face-to-face with the patient during this encounter and >50% of that time was spent on counseling and coordination of care   Olean Ree, FNP-BC  [facility], [facility]  09/04/2019 1:59 PM

## 2019-09-07 ENCOUNTER — Telehealth: Payer: Self-pay | Admitting: Primary Care

## 2019-09-07 DIAGNOSIS — D696 Thrombocytopenia, unspecified: Secondary | ICD-10-CM

## 2019-09-07 NOTE — Telephone Encounter (Signed)
Noted, orders placed. Regina, see notes, you may have to pre-order a CBC with diff in my absence. Patient is responsible for getting labs to her hematologist. Thanks for covering!

## 2019-09-07 NOTE — Telephone Encounter (Signed)
Spoken and notified patient of Alyssa [name] comments. Verbalized understanding. Patient stated that's great.

## 2019-09-07 NOTE — Telephone Encounter (Signed)
Please notify patient that I don't mind, but I'd like to see her for general follow up. I've not seen her since February 2018. I see that she has an appointment scheduled for later this month, that's fine.  Also, we can accommodate the lab request, but she will need to be responsible for making sure her hematologist sees the results, we will make sure they get to her my chart account. The hematologist should be able to see the results online through our system, but will have to be notified by her that they are ready.  Sound good?

## 2019-09-07 NOTE — Telephone Encounter (Signed)
Patient called in stating she sees a specialist at [facility]. MD there is requesting she have labs done every single week, and pt would like to come here and have them done. Patient is needing a complete blood panel with differential weekly. Please advise if able to be done at our office.

## 2019-09-08 NOTE — Telephone Encounter (Signed)
noted 

## 2019-09-11 ENCOUNTER — Other Ambulatory Visit: Payer: Self-pay

## 2019-09-11 ENCOUNTER — Other Ambulatory Visit (INDEPENDENT_AMBULATORY_CARE_PROVIDER_SITE_OTHER): Payer: Managed Care, Other (non HMO)

## 2019-09-11 DIAGNOSIS — D696 Thrombocytopenia, unspecified: Secondary | ICD-10-CM

## 2019-09-12 LAB — CBC WITH DIFFERENTIAL/PLATELET
Basophils Absolute: 0.1 10*3/uL (ref 0.0–0.1)
Basophils Relative: 1.2 % (ref 0.0–3.0)
Eosinophils Absolute: 0.2 10*3/uL (ref 0.0–0.7)
Eosinophils Relative: 2.2 % (ref 0.0–5.0)
HCT: 36.8 % (ref 36.0–46.0)
Hemoglobin: 12.5 g/dL (ref 12.0–15.0)
Lymphocytes Relative: 36.6 % (ref 12.0–46.0)
Lymphs Abs: 2.9 10*3/uL (ref 0.7–4.0)
MCHC: 33.9 g/dL (ref 30.0–36.0)
MCV: 84.8 fl (ref 78.0–100.0)
Monocytes Absolute: 0.6 10*3/uL (ref 0.1–1.0)
Monocytes Relative: 7.3 % (ref 3.0–12.0)
Neutro Abs: 4.1 10*3/uL (ref 1.4–7.7)
Neutrophils Relative %: 52.7 % (ref 43.0–77.0)
Platelets: 69 10*3/uL — ABNORMAL LOW (ref 150.0–400.0)
RBC: 4.33 Mil/uL (ref 3.87–5.11)
RDW: 13.7 % (ref 11.5–15.5)
WBC: 7.8 10*3/uL (ref 4.0–10.5)

## 2019-09-13 ENCOUNTER — Other Ambulatory Visit: Payer: Self-pay | Admitting: Internal Medicine

## 2019-09-13 DIAGNOSIS — D696 Thrombocytopenia, unspecified: Secondary | ICD-10-CM

## 2019-09-18 ENCOUNTER — Other Ambulatory Visit (INDEPENDENT_AMBULATORY_CARE_PROVIDER_SITE_OTHER): Payer: Managed Care, Other (non HMO)

## 2019-09-18 ENCOUNTER — Other Ambulatory Visit: Payer: Self-pay

## 2019-09-18 DIAGNOSIS — D696 Thrombocytopenia, unspecified: Secondary | ICD-10-CM | POA: Diagnosis not present

## 2019-09-18 LAB — CBC WITH DIFFERENTIAL/PLATELET
Basophils Absolute: 0.1 10*3/uL (ref 0.0–0.1)
Basophils Relative: 1.1 % (ref 0.0–3.0)
Eosinophils Absolute: 0.1 10*3/uL (ref 0.0–0.7)
Eosinophils Relative: 2.3 % (ref 0.0–5.0)
HCT: 35.4 % — ABNORMAL LOW (ref 36.0–46.0)
Hemoglobin: 12 g/dL (ref 12.0–15.0)
Lymphocytes Relative: 38.3 % (ref 12.0–46.0)
Lymphs Abs: 1.8 10*3/uL (ref 0.7–4.0)
MCHC: 33.8 g/dL (ref 30.0–36.0)
MCV: 84.5 fl (ref 78.0–100.0)
Monocytes Absolute: 0.4 10*3/uL (ref 0.1–1.0)
Monocytes Relative: 8.4 % (ref 3.0–12.0)
Neutro Abs: 2.3 10*3/uL (ref 1.4–7.7)
Neutrophils Relative %: 49.9 % (ref 43.0–77.0)
Platelets: 52 10*3/uL — ABNORMAL LOW (ref 150.0–400.0)
RBC: 4.19 Mil/uL (ref 3.87–5.11)
RDW: 13.9 % (ref 11.5–15.5)
WBC: 4.6 10*3/uL (ref 4.0–10.5)

## 2019-09-22 IMAGING — US OBSTETRIC 14+ WK ULTRASOUND
1 series · 14 of 28 positions shown · non-contrast
Comparison: none

CLINICAL DATA: Anatomic evaluation

EXAM:
OBSTETRICAL ULTRASOUND >14 WKS

[Series 1: obstetric 14+ wk ultrasound · 14 of 98 slices shown]
[im 4/98]
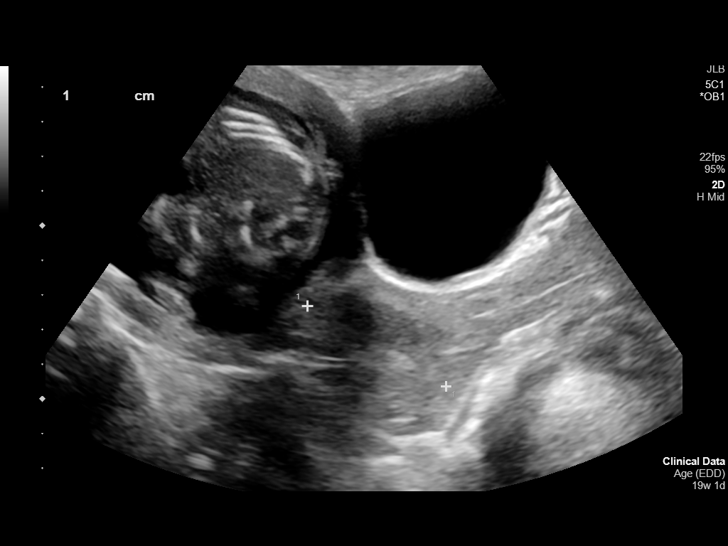
[im 11/98]
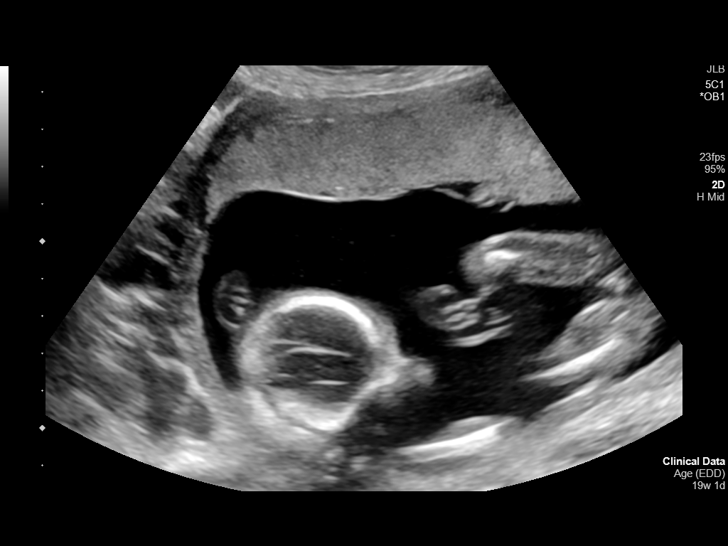
[im 18/98]
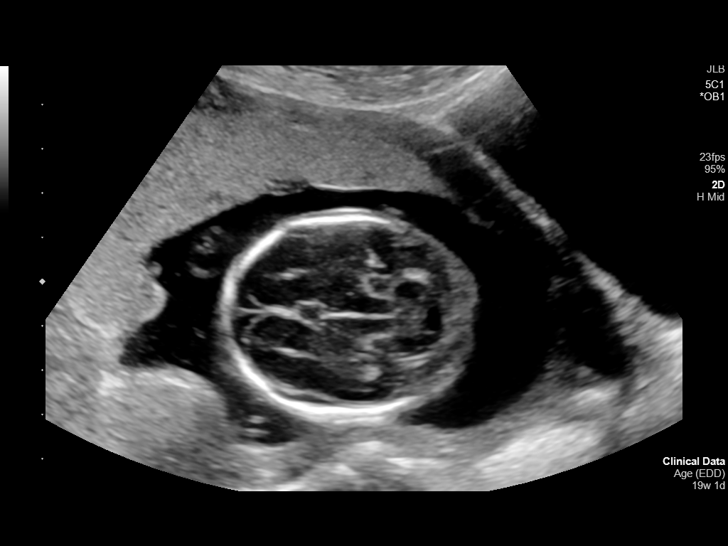
[im 26/98]
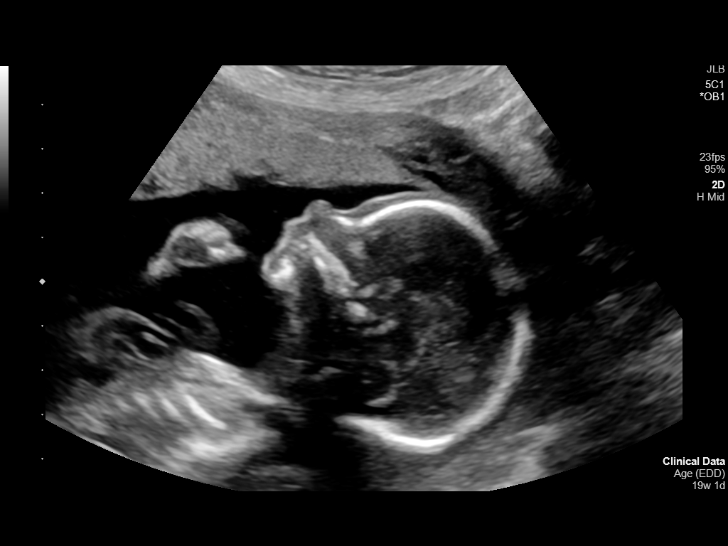
[im 33/98]
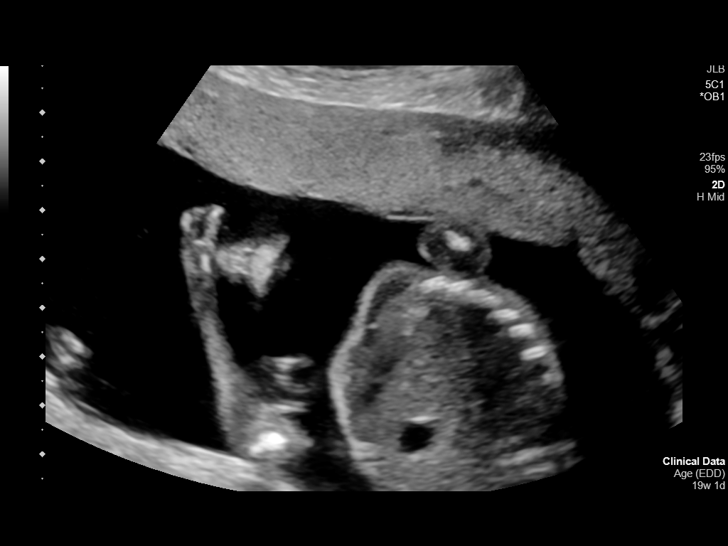
[im 40/98]
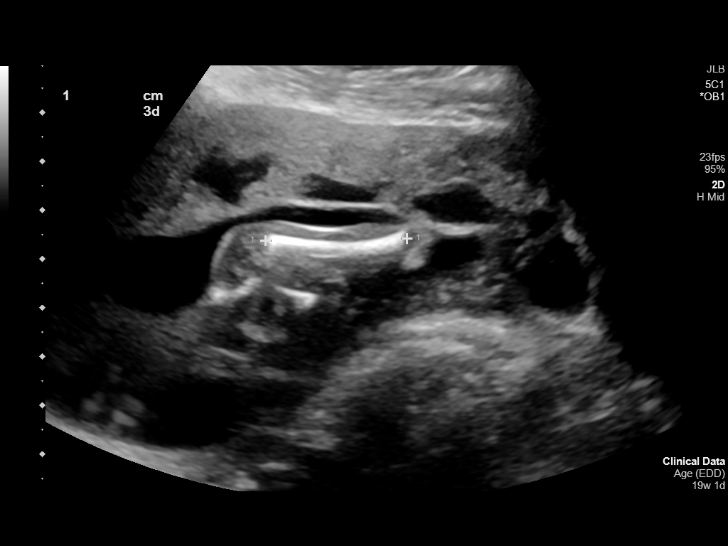
[im 47/98]
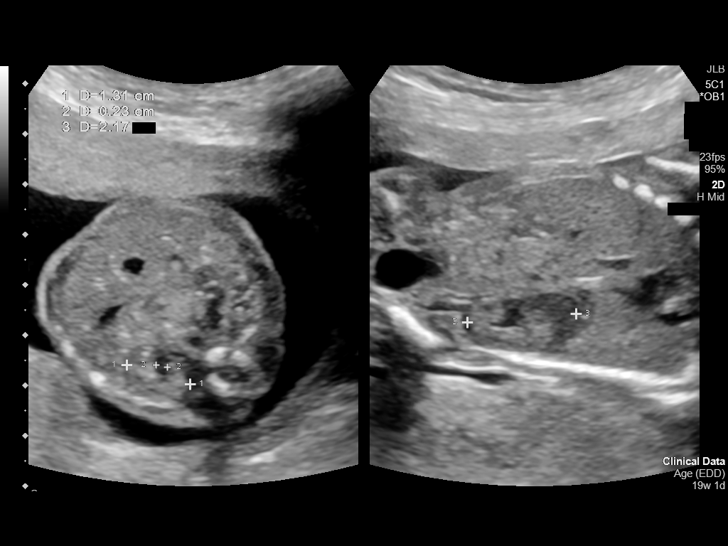
[im 54/98]
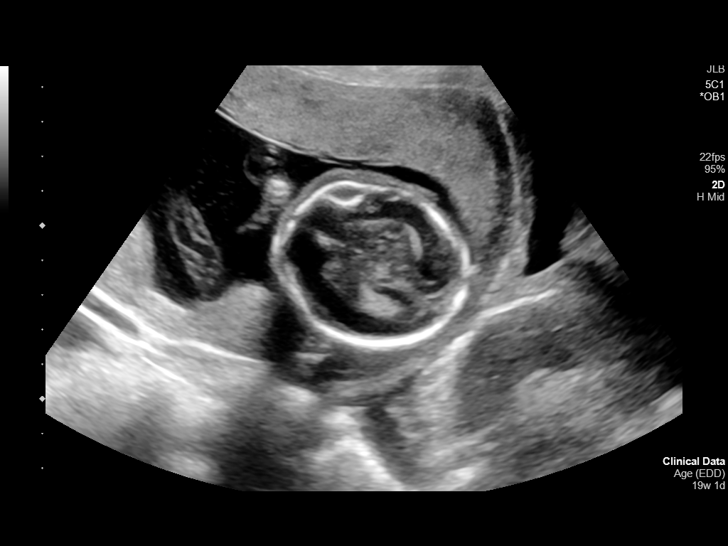
[im 62/98]
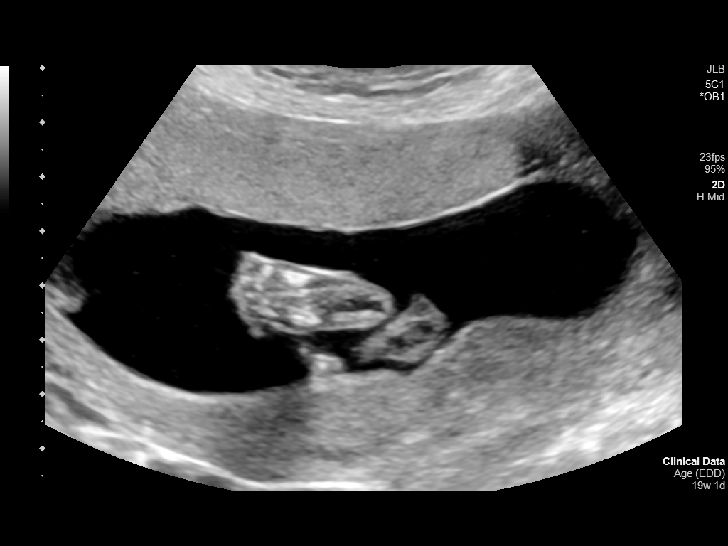
[im 69/98]
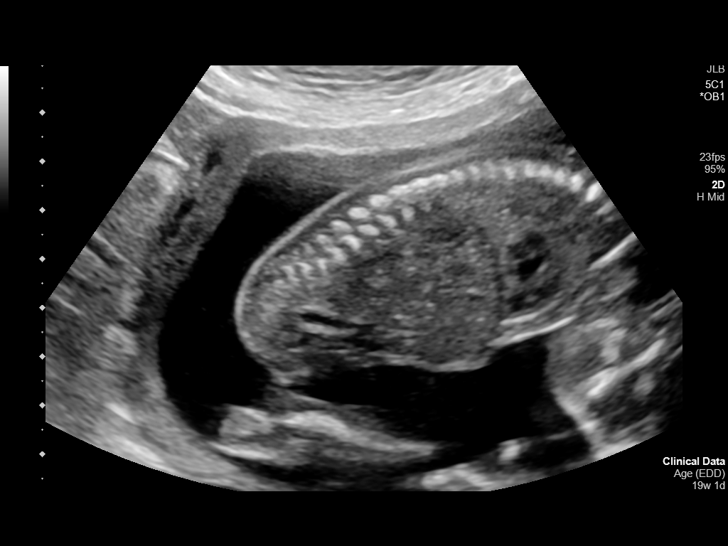
[im 76/98]
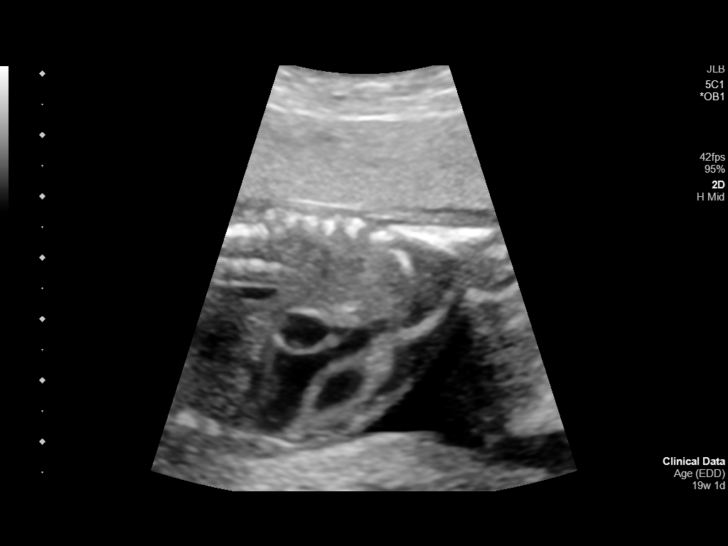
[im 83/98]
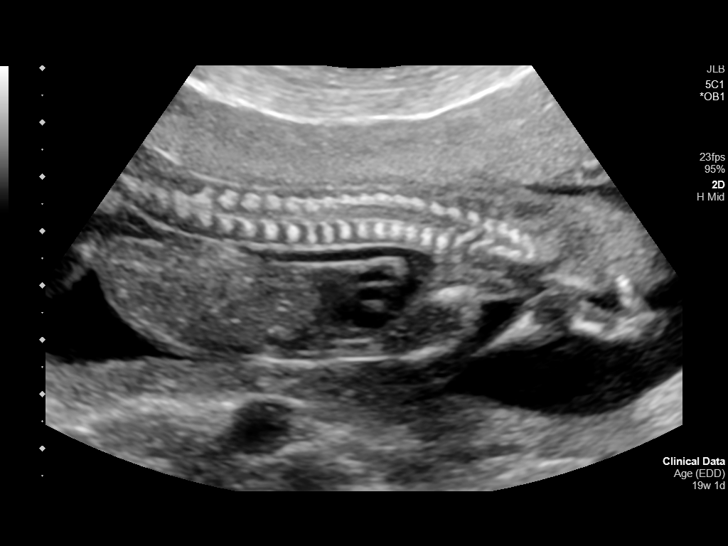
[im 90/98]
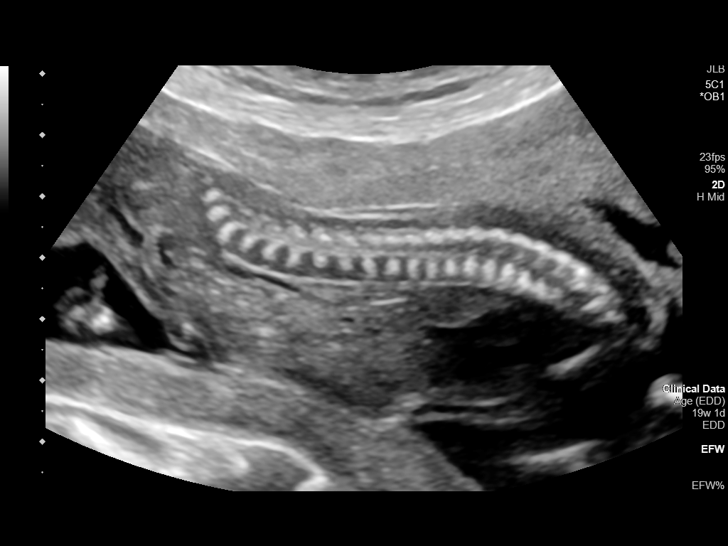
[im 98/98]
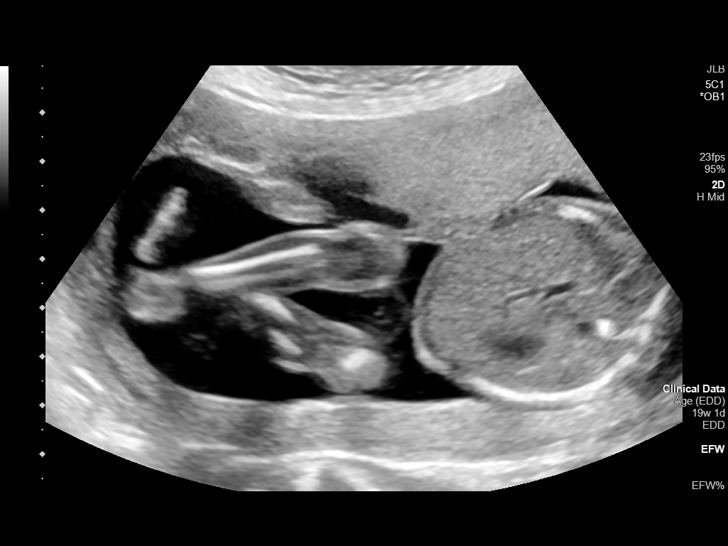

[14 of 28 positions shown; findings below may reference images not displayed]

FINDINGS: Number of Fetuses: 1

Heart Rate:  150 bpm

Movement: Yes

Presentation: Cephalic

Previa: No

Placental Location: Anterior

Amniotic Fluid (Subjective): Normal

Amniotic Fluid (Objective):

Vertical pocket = 4.8cm

FETAL BIOMETRY

BPD: 4.3cm 19w 1d

HC:   16.6cm 19w 2d

AC:   14.01cm 19w 3d

FL:   3.08cm 19[place]

Current Mean GA: 19w 3d US EDC: 09/20/2018

Assigned GA:  19w 1d Assigned EDC: 09/22/2018

FETAL ANATOMY

Lateral Ventricles: Appears normal

Thalami/CSP: Appears normal

Posterior Fossa:  Appears normal

Nuchal Region: Appears normal   NFT= 3.4 mm

Upper Lip: Appears normal

Spine: Appears normal

4 Chamber Heart on Left: Appears normal

LVOT: Appears normal

RVOT: Appears normal

Stomach on Left: Appears normal

3 Vessel Cord: Appears normal

Cord Insertion site: Appears normal

Kidneys: Appears normal

Bladder: Appears normal

Extremities: Appears normal

Technically difficult due to: None

Maternal Findings:

Cervix:  Cervix is closed measuring 4.6 cm in length.
IMPRESSION: 1. Single live intrauterine pregnancy as detailed above.

## 2019-09-26 ENCOUNTER — Telehealth: Payer: Self-pay | Admitting: *Deleted

## 2019-09-26 DIAGNOSIS — D696 Thrombocytopenia, unspecified: Secondary | ICD-10-CM

## 2019-09-26 NOTE — Telephone Encounter (Signed)
Lab orders ready

## 2019-09-26 NOTE — Telephone Encounter (Signed)
Patient called stating that she needs to have her CBC lab work done this week. Patient stated that Mayra Reel NP is aware of her doctor in [place] needing these labs done here. Patient requested a call back with an appointment when the order is in.

## 2019-09-27 DIAGNOSIS — D696 Thrombocytopenia, unspecified: Secondary | ICD-10-CM

## 2019-09-27 NOTE — Telephone Encounter (Signed)
Spoken to patient and lab appointment scheduled

## 2019-09-29 ENCOUNTER — Other Ambulatory Visit (INDEPENDENT_AMBULATORY_CARE_PROVIDER_SITE_OTHER): Payer: Managed Care, Other (non HMO)

## 2019-09-29 ENCOUNTER — Other Ambulatory Visit: Payer: Self-pay

## 2019-09-29 DIAGNOSIS — D696 Thrombocytopenia, unspecified: Secondary | ICD-10-CM | POA: Diagnosis not present

## 2019-09-29 LAB — CBC WITH DIFFERENTIAL/PLATELET
Basophils Absolute: 0.1 10*3/uL (ref 0.0–0.1)
Basophils Relative: 1.2 % (ref 0.0–3.0)
Eosinophils Absolute: 0.4 10*3/uL (ref 0.0–0.7)
Eosinophils Relative: 5.9 % — ABNORMAL HIGH (ref 0.0–5.0)
HCT: 38.6 % (ref 36.0–46.0)
Hemoglobin: 12.8 g/dL (ref 12.0–15.0)
Lymphocytes Relative: 44 % (ref 12.0–46.0)
Lymphs Abs: 2.6 10*3/uL (ref 0.7–4.0)
MCHC: 33.1 g/dL (ref 30.0–36.0)
MCV: 85 fl (ref 78.0–100.0)
Monocytes Absolute: 0.6 10*3/uL (ref 0.1–1.0)
Monocytes Relative: 9.5 % (ref 3.0–12.0)
Neutro Abs: 2.3 10*3/uL (ref 1.4–7.7)
Neutrophils Relative %: 39.4 % — ABNORMAL LOW (ref 43.0–77.0)
Platelets: 66 10*3/uL — ABNORMAL LOW (ref 150.0–400.0)
RBC: 4.54 Mil/uL (ref 3.87–5.11)
RDW: 13.5 % (ref 11.5–15.5)
WBC: 5.9 10*3/uL (ref 4.0–10.5)

## 2019-10-02 ENCOUNTER — Other Ambulatory Visit: Payer: Self-pay

## 2019-10-02 ENCOUNTER — Telehealth (INDEPENDENT_AMBULATORY_CARE_PROVIDER_SITE_OTHER): Payer: Managed Care, Other (non HMO) | Admitting: Primary Care

## 2019-10-02 ENCOUNTER — Encounter: Payer: Self-pay | Admitting: Primary Care

## 2019-10-02 DIAGNOSIS — D696 Thrombocytopenia, unspecified: Secondary | ICD-10-CM

## 2019-10-02 NOTE — Progress Notes (Signed)
Subjective:    Patient ID: Alyssa [name], female    DOB: [birth year], 39 y.o.   MRN: [id number]  HPI  This visit occurred during the SARS-CoV-2 public health emergency.  Safety protocols were in place, including screening questions prior to the visit, additional usage of staff PPE, and extensive cleaning of exam room while observing appropriate contact time as indicated for disinfecting solutions.   Virtual Visit via Video Note  I connected with Alyssa [name] on 10/02/19 at 10:00 AM EDT by a video enabled telemedicine application and verified that I am speaking with the correct person using two identifiers.  Location: Patient: Home Provider: Office Participants: Patient and myself   I discussed the limitations of evaluation and management by telemedicine and the availability of in person appointments. The patient expressed understanding and agreed to proceed.  History of Present Illness:  Alyssa [name] is a 39 year old female with a history of ITP, thrombocytopenia, petechiae who presents today for follow up.  Currently following with hematology through [facility], initial visit in early August 2021. Also with two hospital admissions in July 2021. Previously managed on prednisone 60 mg daily for which she is no longer taking. She is currently getting weekly CBC labs drawn for her hematologist. Her next appointment with her hematologist is scheduled for October 6th.   She would like to see a hematologist through the [facility] for further management of her thrombocytopenia. Her current hematologist is unavailable most of the time she attempts to contact.   She has noticed a petechial rash over the last week, this has improved over the last several days.     Observations/Objective:  Alert and oriented. Appears well, not sickly. No distress. Speaking in complete sentences.   Assessment and Plan:  Referral to be placed for [facility] hematology. She will provide [place] with a name of  who she would like to see.  We discussed reducing the frequency of platelet monitoring given that she's been stable over the last several weeks, she agrees. Repeat CBC due for September 10th.  Follow Up Instructions:  You will be contacted regarding your referral to hematology.  Please let us know if you have not been contacted within two weeks.   Schedule a lab appointment for September 10th for repeat CBC.  It was a pleasure to see you today! Mayra Reel, NP-C    I discussed the assessment and treatment plan with the patient. The patient was provided an opportunity to ask questions and all were answered. The patient agreed with the plan and demonstrated an understanding of the instructions.   The patient was advised to call back or seek an in-person evaluation if the symptoms worsen or if the condition fails to improve as anticipated.    Doreene Nest, NP   Review of Systems  Respiratory: Negative for shortness of breath.   Cardiovascular: Negative for palpitations.  Skin: Positive for rash.  Neurological: Negative for dizziness.       Past Medical History:  Diagnosis Date  . Tobacco use in pregnancy, antepartum 02/07/2018   Cut back from 1/2 ppd to 2 cigarettes- would like to totally quit. Update- has quit 08/2018     Social History   Socioeconomic History  . Marital status: Married    Spouse name: Not on file  . Number of children: Not on file  . Years of education: Not on file  . Highest education level: Not on file  Occupational History  . Not  on file  Tobacco Use  . Smoking status: Current Every Day Smoker  . Smokeless tobacco: Never Used  Vaping Use  . Vaping Use: Never used  Substance and Sexual Activity  . Alcohol use: Not Currently  . Drug use: No  . Sexual activity: Yes    Birth control/protection: None  Other Topics Concern  . Not on file  Social History Narrative   Engaged.   2 children.   Works as a Chartered certified accountant.   Enjoys spending time with  children.     Social Determinants of Health   Financial Resource Strain:   . Difficulty of Paying Living Expenses: Not on file  Food Insecurity:   . Worried About Programme researcher, broadcasting/film/video in the Last Year: Not on file  . Ran Out of Food in the Last Year: Not on file  Transportation Needs:   . Lack of Transportation (Medical): Not on file  . Lack of Transportation (Non-Medical): Not on file  Physical Activity:   . Days of Exercise per Week: Not on file  . Minutes of Exercise per Session: Not on file  Stress:   . Feeling of Stress : Not on file  Social Connections:   . Frequency of Communication with Friends and Family: Not on file  . Frequency of Social Gatherings with Friends and Family: Not on file  . Attends Religious Services: Not on file  . Active Member of Clubs or Organizations: Not on file  . Attends Banker Meetings: Not on file  . Marital Status: Not on file  Intimate Partner Violence:   . Fear of Current or Ex-Partner: Not on file  . Emotionally Abused: Not on file  . Physically Abused: Not on file  . Sexually Abused: Not on file    No past surgical history on file.  Family History  Problem Relation Age of Onset  . Hypertension Mother   . Hypertension Brother   . Breast cancer Maternal Grandmother   . Colon cancer Maternal Grandfather   . Breast cancer Paternal Grandmother     No Known Allergies  Current Outpatient Medications on File Prior to Visit  Medication Sig Dispense Refill  . predniSONE (DELTASONE) 20 MG tablet Take 60 mg by mouth daily.     No current facility-administered medications on file prior to visit.    Ht 5\' 9"  (1.753 m)   Wt 150 lb (68 kg) Comment: per patient  BMI 22.15 kg/m    Objective:   Physical Exam Pulmonary:     Effort: Pulmonary effort is normal.  Neurological:     Mental Status: She is alert and oriented to person, place, and time.  Psychiatric:        Mood and Affect: Mood normal.             Assessment & Plan:

## 2019-10-02 NOTE — Patient Instructions (Signed)
You will be contacted regarding your referral to hematology.  Please let us know if you have not been contacted within two weeks.   Schedule a lab appointment for September 10th for repeat CBC.  It was a pleasure to see you today! Mayra Reel, NP-C

## 2019-10-02 NOTE — Assessment & Plan Note (Signed)
Referral to be placed for [facility] hematology. She will provide [place] with a name of who she would like to see.  We discussed reducing the frequency of platelet monitoring given that she's been stable over the last several weeks, she agrees. Repeat CBC due for September 10th.

## 2019-11-09 ENCOUNTER — Encounter: Payer: Self-pay | Admitting: Primary Care

## 2019-11-13 DIAGNOSIS — B9681 Helicobacter pylori [H. pylori] as the cause of diseases classified elsewhere: Secondary | ICD-10-CM | POA: Diagnosis not present

## 2019-12-13 ENCOUNTER — Other Ambulatory Visit: Payer: Self-pay

## 2019-12-13 ENCOUNTER — Ambulatory Visit: Payer: BC Managed Care – PPO | Admitting: Primary Care

## 2019-12-13 ENCOUNTER — Encounter: Payer: Self-pay | Admitting: Primary Care

## 2019-12-13 VITALS — BP 108/76 | HR 84 | Temp 98.6°F | Wt 145.0 lb

## 2019-12-13 DIAGNOSIS — N898 Other specified noninflammatory disorders of vagina: Secondary | ICD-10-CM

## 2019-12-13 NOTE — Assessment & Plan Note (Addendum)
1 week history associated with the use of new products suspect BV vs yeast infection.   Wet prep pending. STD screening offered pt declines at this time.  Will await results of wet prep.   Agree with assessment and plan. Doreene Nest, NP

## 2019-12-13 NOTE — Patient Instructions (Signed)
We will be in contact soon with your results and will send you the appropriate medication for treatment.   It was a pleasure to see you today!

## 2019-12-13 NOTE — Progress Notes (Signed)
   Subjective:    Patient ID: Alyssa [name], female    DOB: [birth year], 39 y.o.   MRN: [id number]  HPI  This visit occurred during the SARS-CoV-2 public health emergency.  Safety protocols were in place, including screening questions prior to the visit, additional usage of staff PPE, and extensive cleaning of exam room while observing appropriate contact time as indicated for disinfecting solutions.   Mrs. Strength is a 39 year old female with a history of ITP, thrombocytopenia & petechiae who presents today with a chief complaint of possible yeast infection.   She has had vaginal itching for one week after using a new shaving cream. She has also noticed a new yellow discharge. No pain or burning. She also feels like the outside of her vagina is swollen and irritated. She has no urinary symptoms associated with this. Denies any N/V/D. She does have a mild left lower cramping in her left pelvic region that has been intermittent. No concerns for STDs.   BP Readings from Last 3 Encounters:  12/13/19 108/76  09/04/19 110/80  08/17/19 117/84     Review of Systems  Constitutional: Negative.   Respiratory: Negative.   Cardiovascular: Negative.   Gastrointestinal: Negative.  Negative for abdominal pain, diarrhea, nausea and vomiting.  Genitourinary: Positive for pelvic pain and vaginal discharge. Negative for difficulty urinating, dyspareunia, dysuria, flank pain, frequency, genital sores and urgency.  Musculoskeletal: Negative.   Hematological: Negative.          Objective:   Physical Exam Exam conducted with a chaperone present.  Cardiovascular:     Rate and Rhythm: Normal rate and regular rhythm.  Pulmonary:     Effort: Pulmonary effort is normal.  Genitourinary:    Pubic Area: No rash.      Labia:        Right: No rash or lesion.        Left: No rash or lesion.     Musculoskeletal:        General: Normal range of motion.  Skin:    General: Skin is warm and dry.      Capillary Refill: Capillary refill takes less than 2 seconds.  Neurological:     General: No focal deficit present.     Mental Status: She is alert.           Assessment & Plan:

## 2019-12-13 NOTE — Progress Notes (Signed)
Subjective:    Patient ID: Alyssa [name], female    DOB: 1980/10/01, 39 y.o.   MRN: [id number]  HPI  This visit occurred during the SARS-CoV-2 public health emergency.  Safety protocols were in place, including screening questions prior to the visit, additional usage of staff PPE, and extensive cleaning of exam room while observing appropriate contact time as indicated for disinfecting solutions.   Alyssa [name] is a 39 year old female with a history of thrombocytopenia and petechiae who presents today with a chief complaint of vaginal itching.  She has also noticed yellowish discharge, left lower pelvic cramping, vaginal skin irritation.  Her vaginal itching began about 1 week ago after using a new shaving cream.  She denies urinary symptoms.  She is in a monogamous relationship with her husband, no concerns for STD.  Review of Systems  Gastrointestinal: Negative for abdominal pain and nausea.  Genitourinary: Positive for pelvic pain and vaginal discharge. Negative for dysuria, flank pain and hematuria.       Vaginal itching       Past Medical History:  Diagnosis Date  . Advanced maternal age in multigravida, unspecified trimester 02/07/2018   NIPT normal Labor risks discussed  . Postpartum care following vaginal delivery 09/30/2018  . Supervision of other high risk pregnancies, unspecified trimester 02/07/2018     [facility] Prenatal Labs Dating 9 week [place] Blood type: O/Positive/-- (01/06 1416)  Genetic Screen NIPS: Negative XX Antibody:Negative (01/06 1416) Anatomic [place] Complete [facility] Rubella: 1.59 (01/06 1416) Varicella: Immune GTT 28 wk: 70 RPR: Non Reactive (01/06 1416)  Rhogam O POS HBsAg: Negative (01/06 1416)  TDaP vaccine  07/26/2018                     HIV: Non Reactive (01/06 1416)  Flu Shot     . Tobacco use in pregnancy, antepartum 02/07/2018   Cut back from 1/2 ppd to 2 cigarettes- would like to totally quit. Update- has quit 08/2018     Social History   Socioeconomic History   . Marital status: Married    Spouse name: Not on file  . Number of children: Not on file  . Years of education: Not on file  . Highest education level: Not on file  Occupational History  . Not on file  Tobacco Use  . Smoking status: Current Every Day Smoker  . Smokeless tobacco: Never Used  Vaping Use  . Vaping Use: Never used  Substance and Sexual Activity  . Alcohol use: Not Currently  . Drug use: No  . Sexual activity: Yes    Birth control/protection: None  Other Topics Concern  . Not on file  Social History Narrative   Engaged.   2 children.   Works as a Chartered certified accountant.   Enjoys spending time with children.     Social Determinants of Health   Financial Resource Strain:   . Difficulty of Paying Living Expenses: Not on file  Food Insecurity:   . Worried About Programme researcher, broadcasting/film/video in the Last Year: Not on file  . Ran Out of Food in the Last Year: Not on file  Transportation Needs:   . Lack of Transportation (Medical): Not on file  . Lack of Transportation (Non-Medical): Not on file  Physical Activity:   . Days of Exercise per Week: Not on file  . Minutes of Exercise per Session: Not on file  Stress:   . Feeling of Stress : Not on file  Social Connections:   .  Frequency of Communication with Friends and Family: Not on file  . Frequency of Social Gatherings with Friends and Family: Not on file  . Attends Religious Services: Not on file  . Active Member of Clubs or Organizations: Not on file  . Attends Banker Meetings: Not on file  . Marital Status: Not on file  Intimate Partner Violence:   . Fear of Current or Ex-Partner: Not on file  . Emotionally Abused: Not on file  . Physically Abused: Not on file  . Sexually Abused: Not on file    No past surgical history on file.  Family History  Problem Relation Age of Onset  . Hypertension Mother   . Hypertension Brother   . Breast cancer Maternal Grandmother   . Colon cancer Maternal Grandfather   .  Breast cancer Paternal Grandmother   . Juvenile Rhematoid Arthritis Niece   . Other Child     No Known Allergies  Current Outpatient Medications on File Prior to Visit  Medication Sig Dispense Refill  . ferrous sulfate 325 (65 FE) MG tablet Take 325 mg by mouth daily with breakfast.    . omeprazole (PRILOSEC) 20 MG capsule Take 20 mg by mouth 2 (two) times daily.     No current facility-administered medications on file prior to visit.    BP 108/76   Pulse 84   Temp 98.6 F (37 C) (Temporal)   Wt 145 lb (65.8 kg)   LMP 11/20/2019 (Approximate)   SpO2 98%   BMI 21.41 kg/m    Objective:   Physical Exam Pulmonary:     Effort: Pulmonary effort is normal.  Genitourinary:    Labia:        Right: No rash or lesion.        Left: No rash or lesion.      Cervix: Discharge present. No cervical motion tenderness.     Comments: Moderate amount of whitish vaginal discharge noted on exam. Neurological:     Mental Status: She is alert.            Assessment & Plan:

## 2019-12-14 LAB — WET PREP BY MOLECULAR PROBE
Candida species: NOT DETECTED
Gardnerella vaginalis: NOT DETECTED
MICRO NUMBER:: 11185901
SPECIMEN QUALITY:: ADEQUATE
Trichomonas vaginosis: NOT DETECTED

## 2020-01-19 DIAGNOSIS — E611 Iron deficiency: Secondary | ICD-10-CM | POA: Diagnosis not present

## 2020-01-19 DIAGNOSIS — D696 Thrombocytopenia, unspecified: Secondary | ICD-10-CM | POA: Diagnosis not present

## 2020-01-24 DIAGNOSIS — D508 Other iron deficiency anemias: Secondary | ICD-10-CM | POA: Diagnosis not present

## 2020-01-24 DIAGNOSIS — D500 Iron deficiency anemia secondary to blood loss (chronic): Secondary | ICD-10-CM | POA: Diagnosis not present

## 2020-06-08 DIAGNOSIS — D508 Other iron deficiency anemias: Secondary | ICD-10-CM | POA: Diagnosis not present

## 2020-06-08 DIAGNOSIS — D500 Iron deficiency anemia secondary to blood loss (chronic): Secondary | ICD-10-CM | POA: Diagnosis not present

## 2020-09-07 LAB — CBC: RBC: 4.62 (ref 3.87–5.11)

## 2020-09-07 LAB — CBC AND DIFFERENTIAL
HCT: 40 (ref 36–46)
Hemoglobin: 13.3 (ref 12.0–16.0)
Platelets: 118 — AB (ref 150–399)
WBC: 6

## 2020-09-11 ENCOUNTER — Encounter: Payer: Self-pay | Admitting: Hematology & Oncology
